# Patient Record
Sex: Male | Born: 1945 | Race: White | Hispanic: No | Marital: Married | Smoking: Never smoker
Health system: Southern US, Community
[De-identification: ages and names within clinical notes are randomized; demographics above are authoritative.]

## PROBLEM LIST (undated history)

## (undated) DIAGNOSIS — M1990 Unspecified osteoarthritis, unspecified site: Secondary | ICD-10-CM

## (undated) DIAGNOSIS — C61 Malignant neoplasm of prostate: Secondary | ICD-10-CM

## (undated) DIAGNOSIS — Z5189 Encounter for other specified aftercare: Secondary | ICD-10-CM

## (undated) DIAGNOSIS — D649 Anemia, unspecified: Secondary | ICD-10-CM

## (undated) DIAGNOSIS — R972 Elevated prostate specific antigen [PSA]: Secondary | ICD-10-CM

## (undated) DIAGNOSIS — E7800 Pure hypercholesterolemia, unspecified: Secondary | ICD-10-CM

## (undated) DIAGNOSIS — N400 Enlarged prostate without lower urinary tract symptoms: Secondary | ICD-10-CM

## (undated) DIAGNOSIS — N419 Inflammatory disease of prostate, unspecified: Secondary | ICD-10-CM

## (undated) DIAGNOSIS — Q619 Cystic kidney disease, unspecified: Secondary | ICD-10-CM

## (undated) HISTORY — DX: Inflammatory disease of prostate, unspecified: N41.9

## (undated) HISTORY — DX: Benign prostatic hyperplasia without lower urinary tract symptoms: N40.0

## (undated) HISTORY — PX: COLONOSCOPY: SHX174

## (undated) HISTORY — DX: Unspecified osteoarthritis, unspecified site: M19.90

## (undated) HISTORY — DX: Malignant neoplasm of prostate: C61

## (undated) HISTORY — PX: JOINT REPLACEMENT: SHX530

## (undated) HISTORY — DX: Pure hypercholesterolemia, unspecified: E78.00

## (undated) HISTORY — DX: Elevated prostate specific antigen (PSA): R97.20

## (undated) HISTORY — DX: Encounter for other specified aftercare: Z51.89

## (undated) HISTORY — DX: Cystic kidney disease, unspecified: Q61.9

---

## 2007-07-26 DIAGNOSIS — D649 Anemia, unspecified: Secondary | ICD-10-CM

## 2007-07-26 HISTORY — DX: Anemia, unspecified: D64.9

## 2007-07-26 HISTORY — PX: TOTAL HIP ARTHROPLASTY: SHX124

## 2010-04-20 DIAGNOSIS — C61 Malignant neoplasm of prostate: Secondary | ICD-10-CM

## 2010-04-20 HISTORY — PX: PROSTATE BIOPSY: SHX241

## 2010-04-20 HISTORY — DX: Malignant neoplasm of prostate: C61

## 2010-09-23 HISTORY — PX: TOTAL HIP ARTHROPLASTY: SHX124

## 2011-09-12 ENCOUNTER — Encounter: Payer: Self-pay | Admitting: *Deleted

## 2011-09-12 DIAGNOSIS — E7800 Pure hypercholesterolemia, unspecified: Secondary | ICD-10-CM | POA: Insufficient documentation

## 2011-09-12 DIAGNOSIS — N419 Inflammatory disease of prostate, unspecified: Secondary | ICD-10-CM | POA: Insufficient documentation

## 2011-09-12 NOTE — Progress Notes (Signed)
Married, works full time, 3 children

## 2011-09-13 ENCOUNTER — Encounter: Payer: Self-pay | Admitting: Radiation Oncology

## 2011-09-13 ENCOUNTER — Ambulatory Visit
Admission: RE | Admit: 2011-09-13 | Discharge: 2011-09-13 | Disposition: A | Discharge status: Home / Self Care | Payer: BC Managed Care – PPO | Source: Ambulatory Visit | Attending: Radiation Oncology | Admitting: Radiation Oncology

## 2011-09-13 VITALS — BP 123/85 | HR 62 | Temp 97.5°F | Resp 18 | Ht 68.0 in | Wt 168.1 lb

## 2011-09-13 DIAGNOSIS — C61 Malignant neoplasm of prostate: Secondary | ICD-10-CM

## 2011-09-13 DIAGNOSIS — E7800 Pure hypercholesterolemia, unspecified: Secondary | ICD-10-CM | POA: Insufficient documentation

## 2011-09-13 DIAGNOSIS — Z79899 Other long term (current) drug therapy: Secondary | ICD-10-CM | POA: Insufficient documentation

## 2011-09-13 NOTE — Progress Notes (Signed)
Please see the Nurse Progress Note in the MD Initial Consult Encounter for this patient. 

## 2011-09-13 NOTE — Progress Notes (Signed)
Complete NUTRITION RISK SCREEN worksheet without concern submitted to Zenovia Jarred, RD. Also, complete PATIENT MEASURE OF DISTRESS WORKSHEET with a score of 3 submitted to social work.

## 2011-09-13 NOTE — Progress Notes (Signed)
66 year old male. Married with 3 children. Works full time.  Patient has been under active surveillance with Dr. Saddie Benders since 04/20/10 for prostate cancer. Rise in PSA sparked this consultation.  Prostate biopsy done 04/20/10 revealed  right mid, L lat apex prostatic adenocarcinoma gleason 6  L lat base prostatic adenocarcinoma gleason 6 prostatic intraepithelial neoplasia high grade  01/29/2011 PSA 3.4 05/25/2011 PSA 4.4 08/26/2011 PSA 5.2  MW:NUUVO = itching No indication of a pacemaker No hx of radiation therapy

## 2011-09-13 NOTE — Progress Notes (Signed)
[facility] Radiation Oncology NEW PATIENT EVALUATION  Name: [name] MRN: [id number]  Date: 09/13/2011  DOB: May 08, 1946  Status: outpatient   CC: Joseph Pen, MD, MD  Debroah Baller, MD    REFERRING PHYSICIAN: Debroah Baller, MD   DIAGNOSIS: Stage TI C. favorable risk adenocarcinoma prostate    HISTORY OF PRESENT ILLNESS:  [name] is a 66 y.o. male who is seen today for the courtesy of Dr. Saddie Benders for discussion of possible seed implantation in the management of his stage TI C. favorable risk adenocarcinoma prostate he presented with a PSA of 3.56 in the summer of 2011. He underwent ultrasound-guided biopsies on 04/21/2010 diagnosis of Gleason 6 (3+3) involving 2% and 8% biopsies from the right mid gland and left lateral apex. He also had Gleason 6 involving 6% of the biopsy from the left lateral base. His gland size was approximately 30 cc. He elected for close surveillance with PSA determinations every 3 months and a repeat biopsy in 18 months. His PSA in April 2012 was 3.4, rising to 4.4 in October of 2012, and most recently 5.2 on 08/26/2011. He is doing well from a GU and GI standpoint. His I PSS score is 2. He does have some degree of erectile dysfunction and has used Viagra in the past. He is not sexually active.   PREVIOUS RADIATION THERAPY: No   PAST MEDICAL HISTORY:  has a past medical history of BPH (benign prostatic hyperplasia); Hypercholesterolemia; Prostatitis; Prostate cancer (04/20/2010); Unspecified congenital cystic kidney disease; Elevated prostate specific antigen (PSA); and Pure hypercholesterolemia.     PAST SURGICAL HISTORY:  Past Surgical History  Procedure Date  . Total hip arthroplasty 2009    right hip resurfacing  . Prostate biopsy   . Total hip arthroplasty 09/2010    left hip resur[facility] at [facility]     FAMILY HISTORY: family history includes Cancer in his mother and sister; Diabetes in his mother; Heart disease in his brother  and paternal grandfather; Hypertension in his father; and Stroke in his maternal grandmother. His father died at age 17 from a heart attack, and his mother died from complications of diabetes mellitus at 75. His mother was diagnosed with early stage colon cancer in her mid 31s.   SOCIAL HISTORY:  reports that he has never smoked. He has never used smokeless tobacco. He reports that he drinks alcohol. He reports that he does not use illicit drugs. Married with 3 children. 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   ALLERGIES: Ciprofloxacin   MEDICATIONS:  Current Outpatient Prescriptions  Medication Sig Dispense Refill  . Ascorbic Acid (VITAMIN C) 1000 MG tablet Take 1,000 mg by mouth daily.      . fish oil-omega-3 fatty acids 1000 MG capsule Take 2 g by mouth daily.      . Lopinavir-Ritonavir (KALETRA PO) Take by mouth.      . Multiple Vitamin (MULTIVITAMIN) tablet Take 1 tablet by mouth daily.      Marland Kitchen aspirin 81 MG tablet Take 81 mg by mouth daily.          REVIEW OF SYSTEMS:  Pertinent items are noted in HPI.    PHYSICAL EXAM:  height is 5\' 8"  (1.727 m) and weight is 168 lb 1.6 oz (76.25 kg). His oral temperature is 97.5 F (36.4 C). His blood pressure is 123/85 and his pulse is 62. His respiration is 18 and oxygen saturation is 100%.   Head  and neck examination: Grossly unremarkable. Nodes: Without palpable cervical, supraclavicular lymphadenopathy. Chest: Lungs clear. Heart: Regular in rhythm. Back: Without spinal or CVA tenderness. Heart: Regular rate and rhythm. Abdomen: Without masses or organomegaly. Genitalia: Grossly unremarkable. Rectal: The prostate is normal in size and is without focal induration or nodularity. Extremities: Without edema. Neurologic examination grossly nonfocal.   LABORATORY DATA:  No results found for this basename: WBC, HGB, HCT, MCV, PLT   No results found for this basename: NA, K, CL, CO2   No results found  for this basename: ALT, AST, GGT, ALKPHOS, BILITOT   PSA 5.2 from 08/26/2011   IMPRESSION: Stage TI C. favorable risk adenocarcinoma prostate. I explained to the patient that his prognosis is related to his stage, PSA level, and Gleason score. All are favorable. We discussed surgery versus continued close surveillance versus radiation therapy. Radiation therapy options include 8 weeks of external beam/IMRT or seed implantation as monotherapy. Considering his bilateral hip surgery he would probably have the artifact which would affect image guidance for external beam/IMRT. We discussed seed implantation in great detail. We discussed the potential acute and late toxicities including both bladder and rectal toxicity. I feel that this would be a good option for him. He'll now visit Dr[facility]Hemel at [facility] to hear about robotic surgery. The patient will  contact me if he would like to proceed with seed implantation. I would still need to do a CT arch study prior to ordering his seeds. Because of scatter artifact, his post implant dosimetry would be done on the Tomotherapy unit for planning with megavoltage rather than kilovoltage x-rays because of the relative absence of scatter artifact with megavoltage x-rays.   PLAN: As discussed above.   I spent 60 minutes minutes face to face with the patient and more than 50% of that time was spent in counseling and/or coordination of care.

## 2011-09-13 NOTE — Progress Notes (Signed)
Patient presented to the clinic today for new consult with Dr. Dayton Scrape. Patient is unaccompanied. Patient is alert and oriented to person, place, and time. No distress noted. Steady gait noted. Pleasant affect noted. Patient denies pain at this time. Patient reports running 3-4 times per week. Patient denies burning upon urination or hematuria. Patient reports that he may get up once per night to void. Patient brought disc with recent "CAT scan without and with dye from [facility] for Dr. Dayton Scrape to review. Patient has no other complaints. Patient reports that he is hear today to discuss radiation therapy with Dr. Dayton Scrape so that he can decide if he want to have robotic surgery or radiation. Reported all findings to Dr. Dayton Scrape

## 2011-09-13 NOTE — Progress Notes (Signed)
Per Stanton Kidney at Dr. Francis Dowse' office reports this patient's prostate volume was 27.4 on the  04/20/2010 ultrasound.

## 2011-09-14 NOTE — Progress Notes (Signed)
Encounter addended by: Ardell Isaacs, RN on: 09/14/2011 12:30 PM<BR>     Documentation filed: Charges VN, Inpatient Patient Education

## 2011-11-29 ENCOUNTER — Encounter: Payer: Self-pay | Admitting: Radiation Oncology

## 2011-11-30 ENCOUNTER — Ambulatory Visit
Admission: RE | Admit: 2011-11-30 | Discharge: 2011-11-30 | Disposition: A | Discharge status: Home / Self Care | Payer: BC Managed Care – PPO | Source: Ambulatory Visit | Attending: Radiation Oncology | Admitting: Radiation Oncology

## 2011-11-30 ENCOUNTER — Encounter: Payer: Self-pay | Admitting: Radiation Oncology

## 2011-11-30 VITALS — BP 119/74 | HR 74 | Temp 97.6°F | Resp 20 | Ht 68.0 in | Wt 169.3 lb

## 2011-11-30 DIAGNOSIS — C61 Malignant neoplasm of prostate: Secondary | ICD-10-CM

## 2011-11-30 NOTE — Progress Notes (Signed)
Simulation/treatment planning note:  The patient was taken to the CT simulator. His pelvis was scanned. I contoured the prostate and projected the prostate along the pubic arch. The bony arch was open. The prostate volume is 33.9 cc and a prosthetic length is 3.6 cm. I prescribing 14,500 cGy to the prostate utilizing I-125. He'll be implanted with Nucletron system.

## 2011-11-30 NOTE — Progress Notes (Signed)
Please see the Nurse Progress Note in the MD Initial Consult Encounter for this patient. 

## 2011-11-30 NOTE — Progress Notes (Signed)
Encounter addended by: Maryln Gottron, MD on: 11/30/2011  5:00 PM<BR>     Documentation filed: Flowsheet VN

## 2011-11-30 NOTE — Progress Notes (Signed)
Followup note:  Diagnosis: Stage TI C. favorable risk adenocarcinoma prostate  The patient returns today for review and scheduling of prostate seed implantation in the management of his stage TI C. favorable risk adenocarcinoma prostate. I saw the patient in consultation on 09/13/2011 at which time he presented with a PSA of 3.56 in the summer of 2011 with ultrasound guided biopsies on 04/21/2010 diagnostic for Gleason 6 (3+3) involving 2% and 8% of biopsies from the right mid gland and left lateral apex, respectively. He also had Gleason 6 involving 6% of one biopsy from the left lateral base. His gland volume was 30 cc. He elected for close surveillance with a rise in his PSA to 3.4 by April 2012, 4.4 in October 2012 and most recently 5.2 on 08/26/2011. His PSA was 4.7 on 11/23/2011. He was going to see Dr. Creed Copper At [facility] to investigate robotic surgery, but he decide on seed implantation and canceled his appointment with Dr. Creed Copper. Again, he is doing well from a GU and GI standpoint.  Physical examination: Not performed.  Impression: Stage TI C. favorable risk adenocarcinoma prostate. I again discussed the potential acute and late toxicities of radiation therapy and he wishes to proceed as outlined. We will proceed with a CT arch study today and they get him scheduled for seed implantation with Dr. Albin Felling in approximately 4-6 weeks. Consent is signed today.  30 minutes was spent face-to-face with the patient, primarily counseling the patient and coordinating his care.

## 2011-11-30 NOTE — Progress Notes (Addendum)
Married, 3 children  11/23/11 PSA 4.7

## 2011-12-01 NOTE — Progress Notes (Signed)
Encounter addended by: Glennie Hawk, RN on: 12/01/2011  1:30 PM<BR>     Documentation filed: Charges VN

## 2011-12-02 ENCOUNTER — Encounter: Payer: Self-pay | Admitting: Radiation Oncology

## 2011-12-02 NOTE — Progress Notes (Signed)
CT arch study.    

## 2011-12-06 ENCOUNTER — Telehealth: Payer: Self-pay | Admitting: *Deleted

## 2011-12-06 NOTE — Telephone Encounter (Signed)
CALLED PATIENT TO INFORM OF IMPLANT DATE, LVM FOR A RETURN CALL 

## 2011-12-07 ENCOUNTER — Telehealth: Payer: Self-pay | Admitting: *Deleted

## 2011-12-07 NOTE — Telephone Encounter (Signed)
CALLED PATIENT TO INFORM OF IMPLANT DATE , LEFT MESSAGE ON ANSWERING MACHINE FOR A RETURN CALL

## 2011-12-13 ENCOUNTER — Ambulatory Visit (HOSPITAL_COMMUNITY)
Admission: RE | Admit: 2011-12-13 | Discharge: 2011-12-13 | Disposition: A | Discharge status: Home / Self Care | Payer: BC Managed Care – PPO | Source: Ambulatory Visit | Attending: Urology | Admitting: Urology

## 2011-12-13 ENCOUNTER — Ambulatory Visit (HOSPITAL_BASED_OUTPATIENT_CLINIC_OR_DEPARTMENT_OTHER)
Admission: RE | Admit: 2011-12-13 | Discharge: 2011-12-13 | Disposition: A | Discharge status: Home / Self Care | Payer: BC Managed Care – PPO | Source: Ambulatory Visit | Attending: Urology | Admitting: Urology

## 2011-12-13 DIAGNOSIS — C61 Malignant neoplasm of prostate: Secondary | ICD-10-CM | POA: Insufficient documentation

## 2011-12-13 DIAGNOSIS — Z01818 Encounter for other preprocedural examination: Secondary | ICD-10-CM | POA: Insufficient documentation

## 2012-01-03 ENCOUNTER — Telehealth: Payer: Self-pay | Admitting: *Deleted

## 2012-01-03 NOTE — Telephone Encounter (Signed)
CALLED PATIENT TO REMIND OF APPT., LVM FOR A RETURN CALL 

## 2012-01-04 ENCOUNTER — Encounter (HOSPITAL_BASED_OUTPATIENT_CLINIC_OR_DEPARTMENT_OTHER): Payer: Self-pay | Admitting: *Deleted

## 2012-01-04 LAB — COMPREHENSIVE METABOLIC PANEL
ALT: 17 U/L (ref 0–53)
AST: 23 U/L (ref 0–37)
Albumin: 4.4 g/dL (ref 3.5–5.2)
Alkaline Phosphatase: 62 U/L (ref 39–117)
CO2: 28 mEq/L (ref 19–32)
Chloride: 102 mEq/L (ref 96–112)
GFR calc non Af Amer: 51 mL/min — ABNORMAL LOW (ref >90)
Potassium: 4.1 mEq/L (ref 3.5–5.1)
Total Bilirubin: 0.4 mg/dL (ref 0.3–1.2)

## 2012-01-04 LAB — PROTIME-INR
INR: 0.89 (ref 0.00–1.49)
Prothrombin Time: 12.2 seconds (ref 11.6–15.2)

## 2012-01-04 LAB — CBC
MCH: 29.8 pg (ref 26.0–34.0)
MCHC: 33.5 g/dL (ref 30.0–36.0)
MCV: 89.1 fL (ref 78.0–100.0)
Platelets: 239 10*3/uL (ref 150–400)
RDW: 12.7 % (ref 11.5–15.5)

## 2012-01-04 NOTE — Progress Notes (Signed)
Pt instructed NPO p MN 6/18 x clear liquids til 0800.  Then absolutely nothing by mouth.  To [facility] 6/19 @ 1245.  Pt reminded to do fleets enema am of surgery.

## 2012-01-10 ENCOUNTER — Telehealth: Payer: Self-pay | Admitting: *Deleted

## 2012-01-10 NOTE — Telephone Encounter (Signed)
CALLED PATIENT TO REMIND OF PROCEDURE FOR 01-11-12, LVM FOR A RETURN CALL 

## 2012-01-10 NOTE — H&P (Signed)
NAMEITAI, BARBIAN NO.:  [id number]  MEDICAL RECORD NO.:  [id number]  LOCATION:                               FACILITY:  [facility]  PHYSICIAN:  Debroah Baller, M.D.     DATE OF BIRTH:  1946-07-01  DATE OF ADMISSION: DATE OF DISCHARGE:                             HISTORY & PHYSICAL   HISTORY OF PRESENT ILLNESS:  This is a 66 year old man with a diagnosis of prostate cancer and has been under active surveillance.  His PSA has increased to 4.7 and he has reviewed his options and has opted for a therapeutic intervention.  Previous prostate biopsy had shown Gleason 6 (3+3) involving 2% and 8% of biopsy cores on the right and one on the left involving 6% of the core.  He at this time has opted for radioactive seed implantation.  PAST MEDICAL HISTORY:  Shows he has a history of elevated cholesterol and history of BPH.  PAST SURGICAL HISTORY:  Include right total hip replacement in 2009.  He does not smoke.  ALLERGIES:  HE HAS AN ALLERGY TO CIPRO, WHICH CAUSES SOME ITCHING.  MEDICATIONS:  His only medication had been low-dose aspirin.  He also takes fish oil, omega-3.  FAMILY HISTORY:  Includes coronary artery disease and his mother was diagnosed with early stage colon cancer in her 60s.  REVIEW OF SYSTEMS:  Show no history of diabetes, chest pain, shortness of breath.  Bowel movements are regular.  Otherwise unremarkable.  PHYSICAL EXAMINATION:  GENERAL:  A well-developed, well-nourished man, in no acute distress. BACK:  Without CVA tenderness. HEAD:  Normocephalic and atraumatic. NECK:  Without masses. LUNGS:  Clear to auscultation. HEART:  Sounds are regular rate and rhythm.  No murmurs. ABDOMEN:  Soft, nontender, scaphoid. GU:  Shows the penis is without lesions.  Both testes are descended without masses.  No external scrotal lesions. RECTAL:  Reveals an approximately 30 g prostate, smooth and nontender. External sphincter normal. EXTREMITIES:  Lower  extremity show no clubbing nor edema nor calf tenderness. NEUROLOGIC:  He is nonfocal.  Pertinent laboratories are pending at this time.  IMPRESSION:  Stage T1-T2 prostate cancer, N0 M0.  PLAN:  The patient has been discussed his options, including surgery and radiation.  He has opted for definitive therapeutic intervention with radioactive seed implantation.  We will go ahead and proceed with this at this time.          ______________________________ Debroah Baller, M.D.     RC/MEDQ  D:  01/10/2012  T:  01/10/2012  Job:  [id number]

## 2012-01-11 ENCOUNTER — Ambulatory Visit (HOSPITAL_BASED_OUTPATIENT_CLINIC_OR_DEPARTMENT_OTHER)
Admission: RE | Admit: 2012-01-11 | Discharge: 2012-01-11 | Disposition: A | Discharge status: Home / Self Care | Payer: BC Managed Care – PPO | Source: Ambulatory Visit | Attending: Urology | Admitting: Urology

## 2012-01-11 ENCOUNTER — Ambulatory Visit (HOSPITAL_COMMUNITY): Payer: BC Managed Care – PPO

## 2012-01-11 ENCOUNTER — Encounter: Payer: Self-pay | Admitting: Radiation Oncology

## 2012-01-11 ENCOUNTER — Ambulatory Visit (HOSPITAL_BASED_OUTPATIENT_CLINIC_OR_DEPARTMENT_OTHER): Payer: BC Managed Care – PPO | Admitting: Anesthesiology

## 2012-01-11 ENCOUNTER — Encounter (HOSPITAL_BASED_OUTPATIENT_CLINIC_OR_DEPARTMENT_OTHER): Payer: Self-pay | Admitting: Anesthesiology

## 2012-01-11 ENCOUNTER — Encounter (HOSPITAL_BASED_OUTPATIENT_CLINIC_OR_DEPARTMENT_OTHER)
Admission: RE | Disposition: A | Discharge status: Home / Self Care | Payer: Self-pay | Source: Ambulatory Visit | Attending: Urology

## 2012-01-11 ENCOUNTER — Encounter (HOSPITAL_BASED_OUTPATIENT_CLINIC_OR_DEPARTMENT_OTHER): Payer: Self-pay | Admitting: *Deleted

## 2012-01-11 ENCOUNTER — Encounter (HOSPITAL_BASED_OUTPATIENT_CLINIC_OR_DEPARTMENT_OTHER): Payer: Self-pay | Admitting: Urology

## 2012-01-11 DIAGNOSIS — M479 Spondylosis, unspecified: Secondary | ICD-10-CM | POA: Insufficient documentation

## 2012-01-11 DIAGNOSIS — C61 Malignant neoplasm of prostate: Secondary | ICD-10-CM

## 2012-01-11 DIAGNOSIS — Z79899 Other long term (current) drug therapy: Secondary | ICD-10-CM | POA: Insufficient documentation

## 2012-01-11 DIAGNOSIS — I77819 Aortic ectasia, unspecified site: Secondary | ICD-10-CM | POA: Insufficient documentation

## 2012-01-11 DIAGNOSIS — Z01812 Encounter for preprocedural laboratory examination: Secondary | ICD-10-CM | POA: Insufficient documentation

## 2012-01-11 HISTORY — PX: RADIOACTIVE SEED IMPLANT: SHX5150

## 2012-01-11 HISTORY — DX: Anemia, unspecified: D64.9

## 2012-01-11 SURGERY — INSERTION, RADIATION SOURCE, PROSTATE
Anesthesia: General

## 2012-01-11 MED ORDER — BELLADONNA ALKALOIDS-OPIUM 16.2-60 MG RE SUPP
RECTAL | Status: DC | PRN
  Administered 2012-01-11: 1 via RECTAL

## 2012-01-11 MED ORDER — LACTATED RINGERS IV SOLN
INTRAVENOUS | Status: DC
  Administered 2012-01-11 (×2): via INTRAVENOUS

## 2012-01-11 MED ORDER — STERILE WATER FOR IRRIGATION IR SOLN
Status: DC | PRN
  Administered 2012-01-11: 1

## 2012-01-11 MED ORDER — GLYCOPYRROLATE 0.2 MG/ML IJ SOLN
INTRAMUSCULAR | Status: DC | PRN
  Administered 2012-01-11: 0.2 mg via INTRAVENOUS

## 2012-01-11 MED ORDER — EPHEDRINE SULFATE 50 MG/ML IJ SOLN
INTRAMUSCULAR | Status: DC | PRN
  Administered 2012-01-11 (×2): 10 mg via INTRAVENOUS

## 2012-01-11 MED ORDER — MIDAZOLAM HCL 5 MG/5ML IJ SOLN
INTRAMUSCULAR | Status: DC | PRN
  Administered 2012-01-11: 2 mg via INTRAVENOUS

## 2012-01-11 MED ORDER — SULFAMETHOXAZOLE-TRIMETHOPRIM 800-160 MG PO TABS: 1.0000 | ORAL_TABLET | Freq: Two times a day (BID) | ORAL | Status: AC

## 2012-01-11 MED ORDER — DEXTROSE 5 % IV SOLN: 1.0000 g | INTRAVENOUS | Status: DC

## 2012-01-11 MED ORDER — FLEET ENEMA 7-19 GM/118ML RE ENEM: 1.0000 | ENEMA | Freq: Once | RECTAL | Status: DC

## 2012-01-11 MED ORDER — CIPROFLOXACIN IN D5W 400 MG/200ML IV SOLN
400.0000 mg | INTRAVENOUS | Status: DC
  Administered 2012-01-11: 400 mg via INTRAVENOUS

## 2012-01-11 MED ORDER — OXYCODONE HCL 5 MG PO TABS: 5.0000 mg | ORAL_TABLET | Freq: Four times a day (QID) | ORAL | Status: AC | PRN

## 2012-01-11 MED ORDER — OXYCODONE HCL 5 MG PO TABS
5.0000 mg | ORAL_TABLET | ORAL | Status: DC | PRN
  Administered 2012-01-11: 5 mg via ORAL

## 2012-01-11 MED ORDER — PROPOFOL 10 MG/ML IV EMUL
INTRAVENOUS | Status: DC | PRN
  Administered 2012-01-11: 180 mg via INTRAVENOUS

## 2012-01-11 MED ORDER — FENTANYL CITRATE 0.05 MG/ML IJ SOLN: 25.0000 ug | INTRAMUSCULAR | Status: DC | PRN

## 2012-01-11 MED ORDER — DEXTROSE IN LACTATED RINGERS 5 % IV SOLN: 100.0000 mL/h | INTRAVENOUS | Status: DC

## 2012-01-11 MED ORDER — ONDANSETRON HCL 4 MG/2ML IJ SOLN
INTRAMUSCULAR | Status: DC | PRN
  Administered 2012-01-11: 4 mg via INTRAVENOUS

## 2012-01-11 MED ORDER — LIDOCAINE HCL (CARDIAC) 20 MG/ML IV SOLN
INTRAVENOUS | Status: DC | PRN
  Administered 2012-01-11: 60 mg via INTRAVENOUS

## 2012-01-11 MED ORDER — FENTANYL CITRATE 0.05 MG/ML IJ SOLN
INTRAMUSCULAR | Status: DC | PRN
  Administered 2012-01-11: 50 ug via INTRAVENOUS
  Administered 2012-01-11 (×6): 25 ug via INTRAVENOUS

## 2012-01-11 MED ORDER — IOHEXOL 350 MG/ML SOLN
INTRAVENOUS | Status: DC | PRN
  Administered 2012-01-11: 3 mL

## 2012-01-11 MED ORDER — DEXAMETHASONE SODIUM PHOSPHATE 4 MG/ML IJ SOLN
INTRAMUSCULAR | Status: DC | PRN
  Administered 2012-01-11: 8 mg via INTRAVENOUS

## 2012-01-11 SURGICAL SUPPLY — 25 items
BLADE SURG ROTATE 9660 (MISCELLANEOUS) ×2 IMPLANT
CATH FOLEY 2WAY SLVR  5CC 16FR (CATHETERS) ×2
CATH FOLEY 2WAY SLVR 5CC 16FR (CATHETERS) ×2 IMPLANT
CATH ROBINSON RED A/P 20FR (CATHETERS) ×2 IMPLANT
CLOTH BEACON ORANGE TIMEOUT ST (SAFETY) ×2 IMPLANT
COVER MAYO STAND STRL (DRAPES) ×2 IMPLANT
COVER TABLE BACK 60X90 (DRAPES) ×2 IMPLANT
DRSG TEGADERM 4X4.75 (GAUZE/BANDAGES/DRESSINGS) ×4 IMPLANT
DRSG TEGADERM 8X12 (GAUZE/BANDAGES/DRESSINGS) ×2 IMPLANT
GLOVE BIO SURGEON STRL SZ7.5 (GLOVE) ×8 IMPLANT
GLOVE BIOGEL M 7.0 STRL (GLOVE) ×2 IMPLANT
GLOVE BIOGEL PI IND STRL 6.5 (GLOVE) ×1 IMPLANT
GLOVE BIOGEL PI INDICATOR 6.5 (GLOVE) ×1
GLOVE ECLIPSE 8.0 STRL XLNG CF (GLOVE) IMPLANT
GLOVE SURG SIGNA 7.5 PF LTX (GLOVE) ×4 IMPLANT
GOWN PREVENTION PLUS LG XLONG (DISPOSABLE) ×2 IMPLANT
GOWN STRL REIN XL XLG (GOWN DISPOSABLE) ×2 IMPLANT
HOLDER FOLEY CATH W/STRAP (MISCELLANEOUS) ×2 IMPLANT
PACK CYSTOSCOPY (CUSTOM PROCEDURE TRAY) ×2 IMPLANT
PAD PREP 24X48 CUFFED NSTRL (MISCELLANEOUS) ×2 IMPLANT
SPONGE GAUZE 4X4 12PLY (GAUZE/BANDAGES/DRESSINGS) ×2 IMPLANT
SYRINGE 10CC LL (SYRINGE) ×2 IMPLANT
UNDERPAD 30X30 INCONTINENT (UNDERPADS AND DIAPERS) ×4 IMPLANT
WATER STERILE IRR 500ML POUR (IV SOLUTION) ×2 IMPLANT
prostate seeds (Urological Implant) ×156 IMPLANT

## 2012-01-11 NOTE — Anesthesia Preprocedure Evaluation (Signed)
Anesthesia Evaluation  Patient identified by MRN, date of birth, ID band Patient awake    Reviewed: Allergy & Precautions, H&P , NPO status , Patient's Chart, lab work & pertinent test results, reviewed documented beta blocker date and time   Airway Mallampati: II TM Distance: >3 FB Neck ROM: Full    Dental  (+) Teeth Intact and Dental Advisory Given   Pulmonary neg pulmonary ROS,  breath sounds clear to auscultation        Cardiovascular Rhythm:Regular Rate:Normal  Denies cardiac symptoms   Neuro/Psych negative neurological ROS  negative psych ROS   GI/Hepatic negative GI ROS, Neg liver ROS,   Endo/Other  negative endocrine ROS  Renal/GU Cr 1.4   Hx prostate cancer    Musculoskeletal negative musculoskeletal ROS (+)   Abdominal   Peds negative pediatric ROS (+)  Hematology negative hematology ROS (+)   Anesthesia Other Findings   Reproductive/Obstetrics negative OB ROS                           Anesthesia Physical Anesthesia Plan  ASA: II  Anesthesia Plan: General   Post-op Pain Management:    Induction: Intravenous  Airway Management Planned: LMA  Additional Equipment:   Intra-op Plan:   Post-operative Plan: Extubation in OR  Informed Consent: I have reviewed the patients History and Physical, chart, labs and discussed the procedure including the risks, benefits and alternatives for the proposed anesthesia with the patient or authorized representative who has indicated his/her understanding and acceptance.   Dental advisory given  Plan Discussed with: CRNA and Surgeon  Anesthesia Plan Comments:         Anesthesia Quick Evaluation

## 2012-01-11 NOTE — H&P (Signed)
H & P was reviewed and there have been no changesCecile Sheerer MD

## 2012-01-11 NOTE — Anesthesia Procedure Notes (Signed)
Procedure Name: LMA Insertion Date/Time: 01/11/2012 2:15 PM Performed by: Renella Cunas D Pre-anesthesia Checklist: Patient identified, Emergency Drugs available, Suction available and Patient being monitored Patient Re-evaluated:Patient Re-evaluated prior to inductionOxygen Delivery Method: Circle System Utilized Preoxygenation: Pre-oxygenation with 100% oxygen Intubation Type: IV induction Ventilation: Mask ventilation without difficulty LMA: LMA inserted LMA Size: 4.0 Number of attempts: 1 Airway Equipment and Method: bite block Placement Confirmation: positive ETCO2 Tube secured with: Tape Dental Injury: Teeth and Oropharynx as per pre-operative assessment

## 2012-01-11 NOTE — Progress Notes (Signed)
Patient had alittle itching previously with cipro. Drs. Dayton Scrape and Dr. Saddie Benders informed patient received cipro with no reaction no itching swelling or breathing difficulties.  It was given at a very slow rate.

## 2012-01-11 NOTE — Progress Notes (Addendum)
[facility] Radiation Oncology Brachytherapy Operative Procedure Note  Name: [name] MRN: [id number]  Date:   12/06/2011           DOB: August 13, 1945  Status:outpatient    WU:[id number],NWGNFA A, MD  Dr. Debroah Baller DIAGNOSIS: A 66 year old gentlemen with stage T1 C. adenocarcinoma of the prostate with a Gleason of 6 and a PSA of 4.7.  PROCEDURE: Insertion of radioactive I-125 seeds into the prostate gland.  RADIATION DOSE: 14,500 cGy, definitive therapy  TECHNIQUE: Lamar Meter was brought to the operating room with Dr. Saddie Benders. He was placed in the dorsolithotomy position. He was catheterized and a rectal tube was inserted. The perineum was shaved, prepped and draped. The ultrasound probe was then introduced into the rectum to see the prostate gland.  TREATMENT DEVICE: A needle grid was attached to the ultrasound probe stand and anchor needles were placed.  COMPLEX ISODOSE CALCULATION: The prostate was imaged in 3D using a sagittal sweep of the prostate probe. These images were transferred to the planning computer. There, the prostate, urethra and rectum were defined on each axial reconstructed image. Then, the software created an optimized plan and a few seed positions were adjusted. Then the accepted plan was uploaded to the seed Selectron afterloading unit.  SPECIAL TREATMENT PROCEDURE/SUPERVISION AND HANDLING: The Nucletron FIRST system was used to place the needles under sagittal guidance. A total of 28 needles were used to deposit 78 seeds in the prostate gland. The individual seed activity was 0.431 mCi for a total implant activity of 33.6 mCi.  COMPLEX SIMULATION: At the end of the procedure, an anterior radiograph of the pelvis was obtained to document seed positioning and count. Cystoscopy was performed to check the urethra and bladder.  MICRODOSIMETRY: At the end of the procedure, the patient was emitting 0.7 mrem/hr at 1 meter. Accordingly, he was considered safe for  hospital discharge.  PLAN: The patient will return to the radiation oncology clinic for post implant CT dosimetry in three weeks.

## 2012-01-11 NOTE — Transfer of Care (Signed)
Immediate Anesthesia Transfer of Care Note  Patient: [name]  Procedure(s) Performed: Procedure(s) (LRB): RADIOACTIVE SEED IMPLANT (N/A)  Patient Location: PACU  Anesthesia Type: General  Level of Consciousness: awake, oriented, sedated and patient cooperative  Airway & Oxygen Therapy: Patient Spontanous Breathing and Patient connected to face mask oxygen  Post-op Assessment: Report given to PACU RN and Post -op Vital signs reviewed and stable  Post vital signs: Reviewed and stable  Complications: No apparent anesthesia complications

## 2012-01-11 NOTE — Op Note (Signed)
Op note dictated- Job #: [id number]

## 2012-01-11 NOTE — Progress Notes (Signed)
End of treatment summary:  Diagnosis: Stage TI C. favorable risk adenocarcinoma prostate  Requesting physician: Dr. Debroah Baller  Intent: Curative  Implant date: 01/11/2012  Site/dose: Prostate 14,500 cGy  Isotope: I-125 utilizing 78 seeds and 28 active needles with an individual seed activity 0.431 mCi per seed for a total implant activity of 33.6 mCi.  Narrative: The patient tolerated implant well and appears to have had a successful Nucletron seed Selectron implant.  Plan: Followup visit with Dr. Saddie Benders tomorrow morning, and a followup visit with me in approximately 3 weeks. At that time we will obtain a CT scan for his post implant dosimetry to assess the quality of his implant.

## 2012-01-11 NOTE — Discharge Instructions (Addendum)
  Post Anesthesia Home Care Instructions  Activity: Get plenty of rest for the remainder of the day. A responsible adult should stay with you for 24 hours following the procedure.  For the next 24 hours, DO NOT: -Drive a car -Advertising copywriter -Drink alcoholic beverages -Take any medication unless instructed by your physician -Make any legal decisions or sign important papers.  Meals: Start with liquid foods such as gelatin or soup. Progress to regular foods as tolerated. Avoid greasy, spicy, heavy foods. If nausea and/or vomiting occur, drink only clear liquids until the nausea and/or vomiting subsides. Call your physician if vomiting continues.  Special Instructions/Symptoms: Your throat may feel dry or sore from the anesthesia or the breathing tube placed in your throat during surgery. If this causes discomfort, gargle with warm salt water. The discomfort s hould disappear within 24 hours.    Radioactive Seed Implant Home Care Instructions   Activity:    Rest for the remainder of the day.  Do not drive or            operate equipment today.  You may resume normal     activities in a few days as instructed by your      physician, without risk of harmful radiation       exposure to those around you, provided you follow     the time and distance precautions on the Radiation     Oncology Instruction Sheet.   Meals: Drink plenty of lipuids and eat light foods, such as     gelatin or soup this evening .  You may return to normal meal    plan tomorrow.  Return To Work: You may return to work as instructed by Designer, multimedia.  Special Instruction: Remove foley catheter ***.  To remove foley, cut      short end of  "Y" on foley catheter.  Drain water.  After the    water has drained, pull catheter from bladder.   If any seeds are    found, use tweezers to pick up seeds and place in a glass    container of any kind and bring to your physician's office.  Call your physician if any of these  symptoms occur:   Persistent or heavy bleeding  Urine stream diminishes or stops completely after catheter is removed  Fever equal to or greater than 101 degrees F  Cloudy urine with a strong foul odor  Severe pain  You may feel some burning pain and/or hesitancy when you urinate after the catheter is removed.  These symptoms may increase over the next few weeks, but should diminish within forur to six weeks.  Applying moist heat to the lower abdomen or a hot tub bath may help relieve the pain.  If the discomfort becomes severe, please call your physician for additional medications.  Follow-up (Date of Return Visit to Physician): ***  Patient:_______________________________   @date @  Nurse:________________________________ @date @

## 2012-01-12 ENCOUNTER — Encounter (HOSPITAL_BASED_OUTPATIENT_CLINIC_OR_DEPARTMENT_OTHER): Payer: Self-pay | Admitting: Urology

## 2012-01-12 NOTE — Cardiovascular Report (Signed)
[name], [name] NO.:  [id number]  MEDICAL RECORD NO.:  [id number]  LOCATION:                               FACILITY:  [facility]  PHYSICIAN:  Debroah Baller, M.D.     DATE OF BIRTH:  April 15, 1946  DATE OF PROCEDURE:  01/11/2012 DATE OF DISCHARGE:                           CARDIAC CATHETERIZATION   HISTORY OF PRESENT ILLNESS:  This is a 66 year old man with a diagnosis of prostate cancer, and a rising PSA.  He was presented his options and has opted for a therapeutic intervention with a radioactive seed implants.  He is here for this today.  PROCEDURE:  Transrectal ultrasound, radioactive seed implantation, and cystoscopy.  SURGEON OF RECORD: 1. Debroah Baller, M.D. 2. Maryln Gottron, M.D.  ANESTHESIA:  General.  PROCEDURE IN DETAIL:  The patient was brought into the operating room, placed on the table in the supine position.  After adequate general anesthesia was achieved, he was carefully placed in exaggerated lithotomy with the Yellofin stirrups.  The perineum was then shaved, prepped, and draped.  A Foley catheter was inserted and the scrotum and its contents were elevated out of the perineal view and __________ lower abdomen using a clear OpSite.  A red rubber catheter was placed into the rectum to expel any excess air.  The transrectal ultrasound probe was then placed.  The prostate was scanned from apex to seminal vesicles, and a good imaging was obtained.  Anchor needles were placed 1 in each lobe.  The drapes were then placed over the patient.  The prostate was then scanned by the Nucletron program from apex to seminal vesicle.  The images were scanned into the program, and then the computer calculations were made for seed placements.  The individual border of the prostate was demarcated by examining each image between Dr. Dayton Scrape and myself to outline the prostatic borders, urethra, and rectum.  Once the computer program had determined the seed  placement, the individual images were then also reviewed in conjunction with the physicist to assure adequate delivery of radioactivity to the whole prostate.  We finalized the plan, and then seed implantation was begun.  Base needles were placed in the appropriate plane, and then the seeds were placed from superior to inferior, encompassing the whole prostate and basal seminal vesicles. The Foley catheter with balloon with contrast helped [place] to visualize the bladder neck.  When we were done, we had a good distribution of seeds and the whole prostate was treated.  Pelvic x-ray was obtained assuring all this.  The Foley catheter was then removed and flexible cystoscopy was performed, and there was no noted seeds within the bladder or prostatic urethra.  The penis was prepped and a Foley was replaced. Rectal exam revealed no intrarectal foreign bodies and __B&O________ suppository was placed for local analgesia at this point, and the procedure was terminated.  The patient had tolerated all this well.  He was then awakened and extubated and sent to the recovery room in good condition.          ______________________________ Debroah Baller, M.D.     RC/MEDQ  D:  01/11/2012  T:  01/12/2012  Job:  [id number] 

## 2012-01-12 NOTE — Anesthesia Postprocedure Evaluation (Signed)
Anesthesia Post Note  Patient: [name]  Procedure(s) Performed: Procedure(s) (LRB): RADIOACTIVE SEED IMPLANT (N/A)  Anesthesia type: General  Patient location: PACU  Post pain: Pain level controlled  Post assessment: Post-op Vital signs reviewed  Last Vitals:  Filed Vitals:   01/11/12 1745  BP: 140/81  Pulse: 78  Temp: 36.1 C  Resp: 16    Post vital signs: Reviewed  Level of consciousness: sedated  Complications: No apparent anesthesia complications

## 2012-01-31 ENCOUNTER — Telehealth: Payer: Self-pay | Admitting: *Deleted

## 2012-01-31 ENCOUNTER — Encounter: Payer: Self-pay | Admitting: Radiation Oncology

## 2012-01-31 NOTE — Telephone Encounter (Signed)
CALLED PATIENT TO REMIND OF APPTS. FOR 02-01-12, LVM FOR A RETURN CALL

## 2012-02-01 ENCOUNTER — Encounter: Payer: Self-pay | Admitting: Radiation Oncology

## 2012-02-01 ENCOUNTER — Ambulatory Visit
Admit: 2012-02-01 | Discharge: 2012-02-01 | Disposition: A | Discharge status: Home / Self Care | Payer: BC Managed Care – PPO | Attending: Radiation Oncology | Admitting: Radiation Oncology

## 2012-02-01 ENCOUNTER — Ambulatory Visit
Admission: RE | Admit: 2012-02-01 | Discharge: 2012-02-01 | Disposition: A | Discharge status: Home / Self Care | Payer: BC Managed Care – PPO | Source: Ambulatory Visit | Attending: Radiation Oncology | Admitting: Radiation Oncology

## 2012-02-01 VITALS — BP 129/83 | HR 64 | Temp 97.8°F | Resp 20 | Wt 167.1 lb

## 2012-02-01 DIAGNOSIS — Z79899 Other long term (current) drug therapy: Secondary | ICD-10-CM | POA: Insufficient documentation

## 2012-02-01 DIAGNOSIS — E7800 Pure hypercholesterolemia, unspecified: Secondary | ICD-10-CM | POA: Insufficient documentation

## 2012-02-01 DIAGNOSIS — C61 Malignant neoplasm of prostate: Secondary | ICD-10-CM

## 2012-02-01 NOTE — Progress Notes (Addendum)
Followup note:  [name] returns today approximately 3 weeks following his seed implant with Dr. Saddie Benders in the management of his stage TI C. favorable risk adenocarcinoma prostate. Following his implant he had difficulty with urinary frequency, urgency and dysuria. His symptoms are improved. He is off Flomax. He is back running. His CT scan for his post  implant dosimetry shows what appears to be a good seed distribution.  Physical examination: Not performed today.  Impression: Satisfactory progress with residual radiation urethritis.  Plan: He is scheduled see Dr. Saddie Benders this Friday for a followup visit. I've not scheduled the patient for a formal followup visit and I ask that Dr. Saddie Benders keep me posted on his progress/PSA determinations.

## 2012-02-01 NOTE — Progress Notes (Signed)
Complex simulation note:  The patient was taken to the CT scanner. He was placed supine. His pelvis was scanned. Specialized software was employed to reduce the scatter artifact from his bilateral hip replacements. The CT data set was sent to the [facility] system for contouring of his prostate and rectum. He is now ready for 3-D simulation.

## 2012-02-01 NOTE — Progress Notes (Signed)
Pt reports improving of s/p prostate seed implant problems. Was taking Flomax but states he doesn't have any more. Continues to experience discomfort w/urination, freq which varies, nocturia x 1, pressure, urgency, slow stream. Denies issues w/bm's, fatigue, loss of appetite. Appt w/Dr Saddie Benders 02/03/12.

## 2012-02-11 ENCOUNTER — Encounter: Payer: Self-pay | Admitting: Radiation Oncology

## 2012-02-11 NOTE — Progress Notes (Signed)
Post implant CT dosimetry/3-D simulation note:  The patient underwent post-implant CT dosimetry/3-D simulation to assess the quality of his prostate seed implant. Dose volume histograms were obtained for the prostate and rectum. His intraoperative prostate volume by ultrasound was 42.4 cc and his prostate volume by CT, postoperatively, was 40.8cc, a good correlation. His prostate D 90 is 120.6% and V100 97.8%,  both excellent. 1.39 cc of rectum received the prescribed dose of 14,500 cGy. This was slightly higher than prescribed/planned. In summary, the patient has excellent coverage of his prostate with a slightly higher than average risk for late rectal toxicity.

## 2012-04-03 ENCOUNTER — Encounter: Payer: Self-pay | Admitting: *Deleted

## 2013-12-17 IMAGING — CR DG CHEST 2V
2 series · 2 of 2 positions shown · non-contrast
Comparison: None.

CLINICAL DATA: Preoperative evaluation.

CHEST - 2 VIEW

[w chest pa]
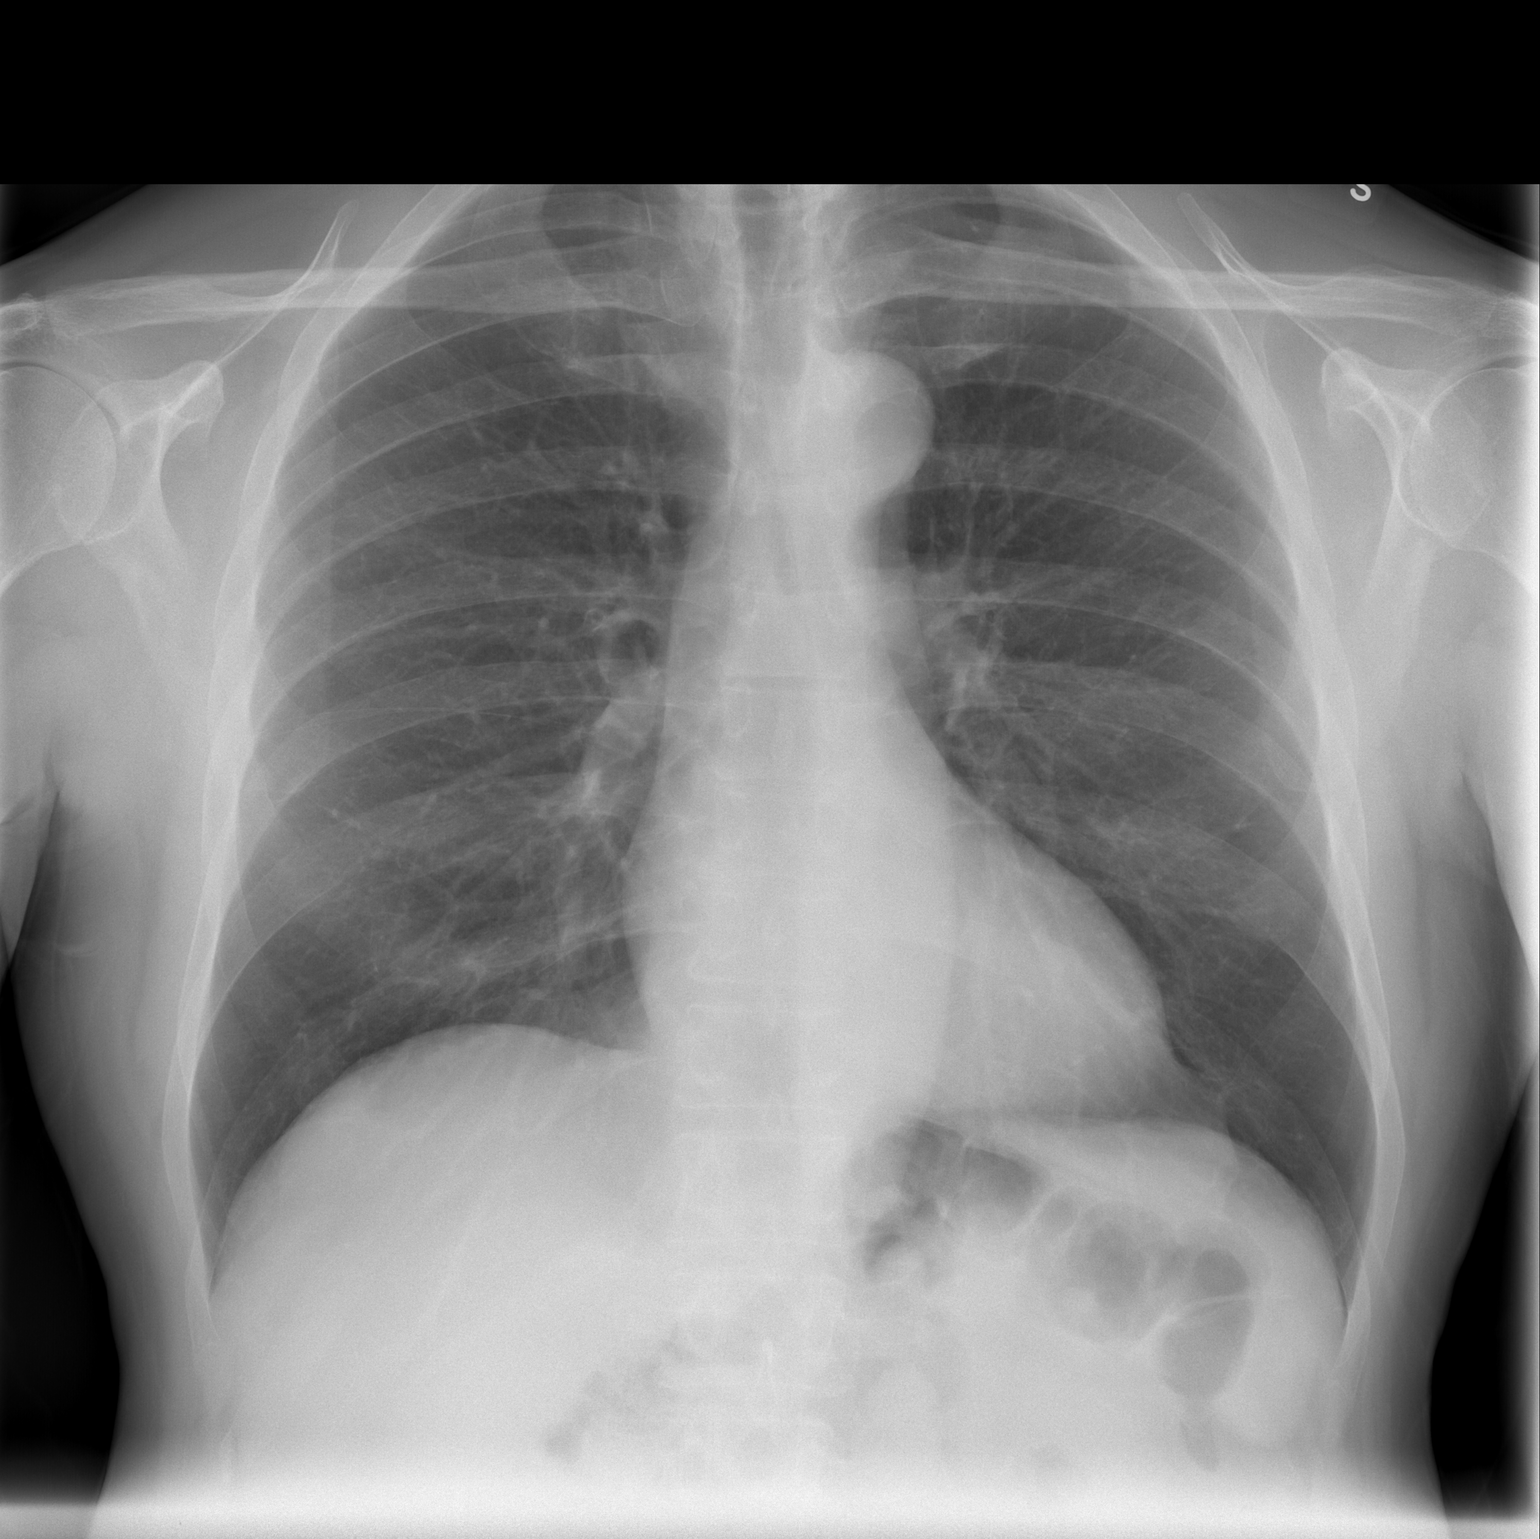

[w chest lat]
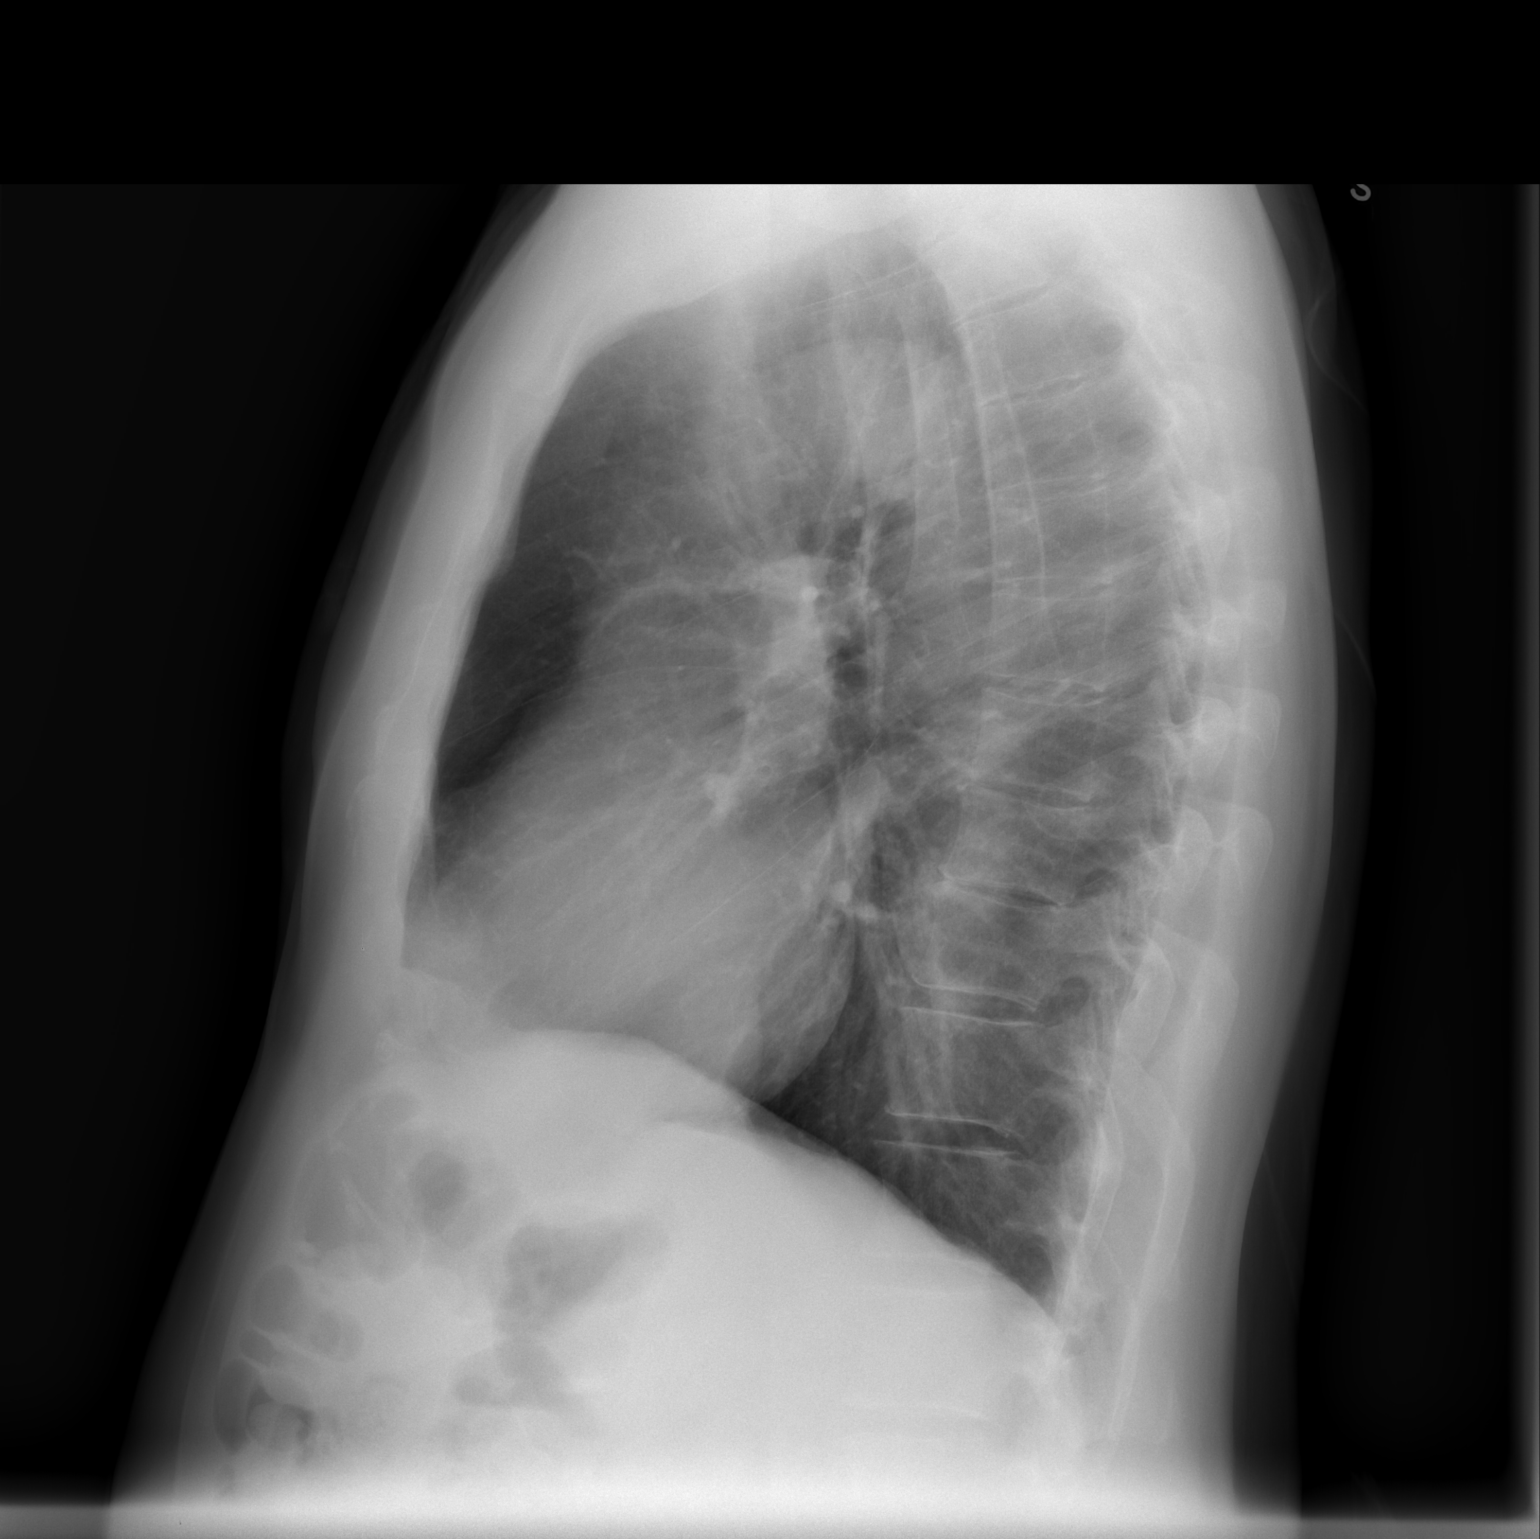

[2 of 2 positions shown; findings below may reference images not displayed]

FINDINGS: Cardiac silhouette is normal size and shape.  Slight
ectasia and tortuosity of thoracic aorta is seen.  No hilar
enlargement is evident.  There is slight flattening of the
diaphragm on lateral image.  This may reflect minimal
hyperinflation configuration.  Minimal degenerative spondylosis
changes are seen. No pleural abnormality is evident.
IMPRESSION: Slight flattening of diaphragm on lateral image may reflect minimal
hyperinflation configuration.  No pulmonary edema, pneumonia, or
other acute superimposed process is evident.

## 2016-01-19 DIAGNOSIS — C61 Malignant neoplasm of prostate: Secondary | ICD-10-CM | POA: Diagnosis not present

## 2016-01-19 DIAGNOSIS — N281 Cyst of kidney, acquired: Secondary | ICD-10-CM | POA: Diagnosis not present

## 2016-05-27 DIAGNOSIS — R69 Illness, unspecified: Secondary | ICD-10-CM | POA: Diagnosis not present

## 2016-06-07 DIAGNOSIS — Z0000 Encounter for general adult medical examination without abnormal findings: Secondary | ICD-10-CM | POA: Diagnosis not present

## 2016-06-07 DIAGNOSIS — Z79899 Other long term (current) drug therapy: Secondary | ICD-10-CM | POA: Diagnosis not present

## 2016-06-07 DIAGNOSIS — Z23 Encounter for immunization: Secondary | ICD-10-CM | POA: Diagnosis not present

## 2016-06-07 DIAGNOSIS — T887XXS Unspecified adverse effect of drug or medicament, sequela: Secondary | ICD-10-CM | POA: Diagnosis not present

## 2016-06-07 DIAGNOSIS — Z1211 Encounter for screening for malignant neoplasm of colon: Secondary | ICD-10-CM | POA: Diagnosis not present

## 2016-06-07 DIAGNOSIS — E782 Mixed hyperlipidemia: Secondary | ICD-10-CM | POA: Diagnosis not present

## 2016-06-07 DIAGNOSIS — C61 Malignant neoplasm of prostate: Secondary | ICD-10-CM | POA: Diagnosis not present

## 2016-06-27 DIAGNOSIS — Z0000 Encounter for general adult medical examination without abnormal findings: Secondary | ICD-10-CM | POA: Diagnosis not present

## 2016-06-27 DIAGNOSIS — Z6828 Body mass index (BMI) 28.0-28.9, adult: Secondary | ICD-10-CM | POA: Diagnosis not present

## 2016-06-27 DIAGNOSIS — E785 Hyperlipidemia, unspecified: Secondary | ICD-10-CM | POA: Diagnosis not present

## 2016-07-05 DIAGNOSIS — Z1211 Encounter for screening for malignant neoplasm of colon: Secondary | ICD-10-CM | POA: Diagnosis not present

## 2016-07-11 DIAGNOSIS — R55 Syncope and collapse: Secondary | ICD-10-CM | POA: Diagnosis not present

## 2016-07-11 DIAGNOSIS — Z8249 Family history of ischemic heart disease and other diseases of the circulatory system: Secondary | ICD-10-CM | POA: Diagnosis not present

## 2016-07-11 DIAGNOSIS — Z789 Other specified health status: Secondary | ICD-10-CM | POA: Diagnosis not present

## 2016-07-11 DIAGNOSIS — R079 Chest pain, unspecified: Secondary | ICD-10-CM | POA: Diagnosis not present

## 2016-07-11 DIAGNOSIS — Z7289 Other problems related to lifestyle: Secondary | ICD-10-CM | POA: Diagnosis not present

## 2016-07-11 DIAGNOSIS — S0990XA Unspecified injury of head, initial encounter: Secondary | ICD-10-CM | POA: Diagnosis not present

## 2016-07-11 DIAGNOSIS — E7800 Pure hypercholesterolemia, unspecified: Secondary | ICD-10-CM | POA: Diagnosis not present

## 2016-07-11 DIAGNOSIS — Z79899 Other long term (current) drug therapy: Secondary | ICD-10-CM | POA: Diagnosis not present

## 2016-07-11 DIAGNOSIS — E785 Hyperlipidemia, unspecified: Secondary | ICD-10-CM | POA: Diagnosis not present

## 2016-07-12 DIAGNOSIS — E785 Hyperlipidemia, unspecified: Secondary | ICD-10-CM | POA: Diagnosis not present

## 2016-07-12 DIAGNOSIS — Z789 Other specified health status: Secondary | ICD-10-CM | POA: Diagnosis not present

## 2016-07-12 DIAGNOSIS — R55 Syncope and collapse: Secondary | ICD-10-CM | POA: Diagnosis not present

## 2016-07-12 DIAGNOSIS — R079 Chest pain, unspecified: Secondary | ICD-10-CM | POA: Diagnosis not present

## 2016-07-27 DIAGNOSIS — R55 Syncope and collapse: Secondary | ICD-10-CM | POA: Diagnosis not present

## 2016-08-17 DIAGNOSIS — C61 Malignant neoplasm of prostate: Secondary | ICD-10-CM | POA: Diagnosis not present

## 2016-08-17 DIAGNOSIS — N3289 Other specified disorders of bladder: Secondary | ICD-10-CM | POA: Diagnosis not present

## 2016-10-19 DIAGNOSIS — Z881 Allergy status to other antibiotic agents status: Secondary | ICD-10-CM | POA: Diagnosis not present

## 2016-10-19 DIAGNOSIS — Z471 Aftercare following joint replacement surgery: Secondary | ICD-10-CM | POA: Diagnosis not present

## 2016-10-19 DIAGNOSIS — Z96653 Presence of artificial knee joint, bilateral: Secondary | ICD-10-CM | POA: Diagnosis not present

## 2016-10-19 DIAGNOSIS — Z96643 Presence of artificial hip joint, bilateral: Secondary | ICD-10-CM | POA: Diagnosis not present

## 2017-02-15 DIAGNOSIS — C61 Malignant neoplasm of prostate: Secondary | ICD-10-CM | POA: Diagnosis not present

## 2017-02-15 DIAGNOSIS — N400 Enlarged prostate without lower urinary tract symptoms: Secondary | ICD-10-CM | POA: Diagnosis not present

## 2017-04-24 DIAGNOSIS — H04123 Dry eye syndrome of bilateral lacrimal glands: Secondary | ICD-10-CM | POA: Diagnosis not present

## 2017-06-30 DIAGNOSIS — M542 Cervicalgia: Secondary | ICD-10-CM | POA: Diagnosis not present

## 2017-06-30 DIAGNOSIS — Z9181 History of falling: Secondary | ICD-10-CM | POA: Diagnosis not present

## 2017-06-30 DIAGNOSIS — Z6827 Body mass index (BMI) 27.0-27.9, adult: Secondary | ICD-10-CM | POA: Diagnosis not present

## 2017-06-30 DIAGNOSIS — Z79899 Other long term (current) drug therapy: Secondary | ICD-10-CM | POA: Diagnosis not present

## 2017-06-30 DIAGNOSIS — Z0000 Encounter for general adult medical examination without abnormal findings: Secondary | ICD-10-CM | POA: Diagnosis not present

## 2017-06-30 DIAGNOSIS — M25511 Pain in right shoulder: Secondary | ICD-10-CM | POA: Diagnosis not present

## 2017-06-30 DIAGNOSIS — Z1331 Encounter for screening for depression: Secondary | ICD-10-CM | POA: Diagnosis not present

## 2017-06-30 DIAGNOSIS — I6529 Occlusion and stenosis of unspecified carotid artery: Secondary | ICD-10-CM | POA: Diagnosis not present

## 2017-06-30 DIAGNOSIS — Z23 Encounter for immunization: Secondary | ICD-10-CM | POA: Diagnosis not present

## 2017-06-30 DIAGNOSIS — G8929 Other chronic pain: Secondary | ICD-10-CM | POA: Diagnosis not present

## 2017-07-12 DIAGNOSIS — M2560 Stiffness of unspecified joint, not elsewhere classified: Secondary | ICD-10-CM | POA: Diagnosis not present

## 2017-07-12 DIAGNOSIS — M25611 Stiffness of right shoulder, not elsewhere classified: Secondary | ICD-10-CM | POA: Diagnosis not present

## 2017-07-12 DIAGNOSIS — M542 Cervicalgia: Secondary | ICD-10-CM | POA: Diagnosis not present

## 2017-07-12 DIAGNOSIS — M25511 Pain in right shoulder: Secondary | ICD-10-CM | POA: Diagnosis not present

## 2017-07-27 DIAGNOSIS — M25611 Stiffness of right shoulder, not elsewhere classified: Secondary | ICD-10-CM | POA: Diagnosis not present

## 2017-07-27 DIAGNOSIS — M2560 Stiffness of unspecified joint, not elsewhere classified: Secondary | ICD-10-CM | POA: Diagnosis not present

## 2017-07-27 DIAGNOSIS — M542 Cervicalgia: Secondary | ICD-10-CM | POA: Diagnosis not present

## 2017-07-27 DIAGNOSIS — M25511 Pain in right shoulder: Secondary | ICD-10-CM | POA: Diagnosis not present

## 2017-08-02 DIAGNOSIS — M25611 Stiffness of right shoulder, not elsewhere classified: Secondary | ICD-10-CM | POA: Diagnosis not present

## 2017-08-02 DIAGNOSIS — M25511 Pain in right shoulder: Secondary | ICD-10-CM | POA: Diagnosis not present

## 2017-08-02 DIAGNOSIS — M542 Cervicalgia: Secondary | ICD-10-CM | POA: Diagnosis not present

## 2017-08-02 DIAGNOSIS — M2560 Stiffness of unspecified joint, not elsewhere classified: Secondary | ICD-10-CM | POA: Diagnosis not present

## 2017-08-11 DIAGNOSIS — M2560 Stiffness of unspecified joint, not elsewhere classified: Secondary | ICD-10-CM | POA: Diagnosis not present

## 2017-08-11 DIAGNOSIS — M25511 Pain in right shoulder: Secondary | ICD-10-CM | POA: Diagnosis not present

## 2017-08-11 DIAGNOSIS — M25611 Stiffness of right shoulder, not elsewhere classified: Secondary | ICD-10-CM | POA: Diagnosis not present

## 2017-08-11 DIAGNOSIS — M542 Cervicalgia: Secondary | ICD-10-CM | POA: Diagnosis not present

## 2017-08-16 DIAGNOSIS — M2560 Stiffness of unspecified joint, not elsewhere classified: Secondary | ICD-10-CM | POA: Diagnosis not present

## 2017-08-16 DIAGNOSIS — M542 Cervicalgia: Secondary | ICD-10-CM | POA: Diagnosis not present

## 2017-08-16 DIAGNOSIS — M25511 Pain in right shoulder: Secondary | ICD-10-CM | POA: Diagnosis not present

## 2017-08-16 DIAGNOSIS — M25611 Stiffness of right shoulder, not elsewhere classified: Secondary | ICD-10-CM | POA: Diagnosis not present

## 2017-08-21 DIAGNOSIS — N400 Enlarged prostate without lower urinary tract symptoms: Secondary | ICD-10-CM | POA: Diagnosis not present

## 2017-08-21 DIAGNOSIS — C61 Malignant neoplasm of prostate: Secondary | ICD-10-CM | POA: Diagnosis not present

## 2017-10-11 ENCOUNTER — Encounter: Payer: Self-pay | Admitting: Gastroenterology

## 2017-11-07 DIAGNOSIS — M7632 Iliotibial band syndrome, left leg: Secondary | ICD-10-CM | POA: Diagnosis not present

## 2017-11-07 DIAGNOSIS — Z6829 Body mass index (BMI) 29.0-29.9, adult: Secondary | ICD-10-CM | POA: Diagnosis not present

## 2017-11-07 DIAGNOSIS — M25562 Pain in left knee: Secondary | ICD-10-CM | POA: Diagnosis not present

## 2018-02-19 DIAGNOSIS — N400 Enlarged prostate without lower urinary tract symptoms: Secondary | ICD-10-CM | POA: Diagnosis not present

## 2018-02-19 DIAGNOSIS — C61 Malignant neoplasm of prostate: Secondary | ICD-10-CM | POA: Diagnosis not present

## 2018-02-19 DIAGNOSIS — Z7689 Persons encountering health services in other specified circumstances: Secondary | ICD-10-CM | POA: Diagnosis not present

## 2018-07-05 DIAGNOSIS — E782 Mixed hyperlipidemia: Secondary | ICD-10-CM | POA: Diagnosis not present

## 2018-08-09 ENCOUNTER — Encounter: Payer: Self-pay | Admitting: Gastroenterology

## 2018-08-23 DIAGNOSIS — Z471 Aftercare following joint replacement surgery: Secondary | ICD-10-CM | POA: Diagnosis not present

## 2018-08-23 DIAGNOSIS — Z96649 Presence of unspecified artificial hip joint: Secondary | ICD-10-CM | POA: Diagnosis not present

## 2018-08-23 DIAGNOSIS — Z96643 Presence of artificial hip joint, bilateral: Secondary | ICD-10-CM | POA: Diagnosis not present

## 2018-08-23 DIAGNOSIS — N281 Cyst of kidney, acquired: Secondary | ICD-10-CM | POA: Diagnosis not present

## 2018-08-23 DIAGNOSIS — X58XXXD Exposure to other specified factors, subsequent encounter: Secondary | ICD-10-CM | POA: Diagnosis not present

## 2018-08-23 DIAGNOSIS — C61 Malignant neoplasm of prostate: Secondary | ICD-10-CM | POA: Diagnosis not present

## 2018-08-23 DIAGNOSIS — Z96642 Presence of left artificial hip joint: Secondary | ICD-10-CM | POA: Diagnosis not present

## 2018-08-23 DIAGNOSIS — T8484XD Pain due to internal orthopedic prosthetic devices, implants and grafts, subsequent encounter: Secondary | ICD-10-CM | POA: Diagnosis not present

## 2018-08-27 DIAGNOSIS — Z8546 Personal history of malignant neoplasm of prostate: Secondary | ICD-10-CM | POA: Diagnosis not present

## 2018-08-27 DIAGNOSIS — R739 Hyperglycemia, unspecified: Secondary | ICD-10-CM | POA: Diagnosis not present

## 2018-08-27 DIAGNOSIS — Z79899 Other long term (current) drug therapy: Secondary | ICD-10-CM | POA: Diagnosis not present

## 2018-08-27 DIAGNOSIS — E782 Mixed hyperlipidemia: Secondary | ICD-10-CM | POA: Diagnosis not present

## 2018-08-27 DIAGNOSIS — Z6826 Body mass index (BMI) 26.0-26.9, adult: Secondary | ICD-10-CM | POA: Diagnosis not present

## 2018-08-27 DIAGNOSIS — E663 Overweight: Secondary | ICD-10-CM | POA: Diagnosis not present

## 2018-08-27 DIAGNOSIS — I6529 Occlusion and stenosis of unspecified carotid artery: Secondary | ICD-10-CM | POA: Diagnosis not present

## 2018-08-29 ENCOUNTER — Encounter: Payer: Self-pay | Admitting: Gastroenterology

## 2018-08-29 ENCOUNTER — Ambulatory Visit (AMBULATORY_SURGERY_CENTER): Payer: Self-pay | Admitting: *Deleted

## 2018-08-29 ENCOUNTER — Other Ambulatory Visit: Payer: Self-pay

## 2018-08-29 VITALS — Ht 68.0 in | Wt 172.8 lb

## 2018-08-29 DIAGNOSIS — Z800 Family history of malignant neoplasm of digestive organs: Secondary | ICD-10-CM

## 2018-08-29 MED ORDER — SUPREP BOWEL PREP KIT 17.5-3.13-1.6 GM/177ML PO SOLN: 1.0000 | Freq: Once | ORAL | 0 refills | Status: AC

## 2018-08-29 NOTE — Progress Notes (Signed)
No egg or soy allergy known to patient  No issues with past sedation with any surgeries  or procedures, no intubation problems  No diet pills per patient No home 02 use per patient  No blood thinners per patient  Pt denies issues with constipation  No A fib or A flutter  EMMI video sent to pt's e mail  No more than 50.00 suprep coupon given

## 2018-08-31 DIAGNOSIS — N281 Cyst of kidney, acquired: Secondary | ICD-10-CM | POA: Diagnosis not present

## 2018-09-12 ENCOUNTER — Ambulatory Visit (AMBULATORY_SURGERY_CENTER): Payer: Medicare HMO | Admitting: Gastroenterology

## 2018-09-12 ENCOUNTER — Encounter: Payer: Self-pay | Admitting: Gastroenterology

## 2018-09-12 VITALS — BP 111/66 | HR 57 | Temp 96.2°F | Resp 13

## 2018-09-12 DIAGNOSIS — Z86010 Personal history of colonic polyps: Secondary | ICD-10-CM | POA: Diagnosis not present

## 2018-09-12 DIAGNOSIS — K6289 Other specified diseases of anus and rectum: Secondary | ICD-10-CM | POA: Diagnosis not present

## 2018-09-12 DIAGNOSIS — D123 Benign neoplasm of transverse colon: Secondary | ICD-10-CM | POA: Diagnosis not present

## 2018-09-12 DIAGNOSIS — Z1211 Encounter for screening for malignant neoplasm of colon: Secondary | ICD-10-CM

## 2018-09-12 DIAGNOSIS — D120 Benign neoplasm of cecum: Secondary | ICD-10-CM | POA: Diagnosis not present

## 2018-09-12 DIAGNOSIS — Z800 Family history of malignant neoplasm of digestive organs: Secondary | ICD-10-CM | POA: Diagnosis not present

## 2018-09-12 MED ORDER — SODIUM CHLORIDE 0.9 % IV SOLN: 500.0000 mL | Freq: Once | INTRAVENOUS | Status: DC

## 2018-09-12 NOTE — Progress Notes (Signed)
Pt's states no medical or surgical changes since previsit or office visit. 

## 2018-09-12 NOTE — Op Note (Signed)
[facility] Patient Name: [name] Procedure Date: 09/12/2018 10:36 AM MRN: [id number] Endoscopist: Jackquline Denmark , MD Age: 73 Referring MD:  Date of Birth: Sep 11, 1945 Gender: Male Account #: [id number] Procedure:                Colonoscopy Indications:              Screening in patient at increased risk: Colorectal                            cancer in mother 63 or older Medicines:                Monitored Anesthesia Care Procedure:                Pre-Anesthesia Assessment:                           - Prior to the procedure, a History and Physical                            was performed, and patient medications and                            allergies were reviewed. The patient's tolerance of                            previous anesthesia was also reviewed. The risks                            and benefits of the procedure and the sedation                            options and risks were discussed with the patient.                            All questions were answered, and informed consent                            was obtained. Prior Anticoagulants: The patient has                            taken no previous anticoagulant or antiplatelet                            agents. ASA Grade Assessment: II - A patient with                            mild systemic disease. After reviewing the risks                            and benefits, the patient was deemed in                            satisfactory condition to undergo the procedure.  After obtaining informed consent, the colonoscope                            was passed under direct vision. Throughout the                            procedure, the patient's blood pressure, pulse, and                            oxygen saturations were monitored continuously. The                            Colonoscope was introduced through the anus and                            advanced to the 2 cm into the ileum.  The                            colonoscopy was performed without difficulty. The                            patient tolerated the procedure well. The quality                            of the bowel preparation was excellent. Scope In: 10:48:59 AM Scope Out: 11:03:56 AM Scope Withdrawal Time: 0 hours 10 minutes 59 seconds  Total Procedure Duration: 0 hours 14 minutes 57 seconds  Findings:                 Three sessile polyps were found in the proximal                            transverse colon and cecum. The polyps were 4 to 6                            mm in size. These polyps were removed with a cold                            snare. Resection and retrieval were complete.                            Estimated blood loss: none.                           A few small-mouthed diverticula were found in the                            sigmoid colon.                           Non-bleeding internal hemorrhoids were found. The                            hemorrhoids were small. Healed posterior anal  fissure was also noted on rectal examination.                           The exam was otherwise without abnormality on                            direct and retroflexion views.                           The terminal ileum appeared normal. Complications:            No immediate complications. Estimated Blood Loss:     Estimated blood loss: none. Impression:               -Three 4 to 6 mm polyps in the proximal transverse                            colon and in the cecum, removed with a cold snare.                            Resected and retrieved.                           -Mild sigmoid diverticulosis.                           -Non-bleeding internal hemorrhoids.                           -Otherwise normal colonoscopy to TI. Recommendation:           - Patient has a contact number available for                            emergencies. The signs and symptoms of potential                             delayed complications were discussed with the                            patient. Return to normal activities tomorrow.                            Written discharge instructions were provided to the                            patient.                           - High fiber diet.                           - Continue present medications.                           - Await pathology results.                           -  Repeat colonoscopy for surveillance based on                            pathology results.                           - Return to GI office PRN. Jackquline Denmark, MD 09/12/2018 11:08:16 AM This report has been signed electronically.

## 2018-09-12 NOTE — Progress Notes (Signed)
Report given to PACU, vss 

## 2018-09-12 NOTE — Progress Notes (Signed)
Called to room to assist during endoscopic procedure.  Patient ID and intended procedure confirmed with present staff. Received instructions for my participation in the procedure from the performing physician.  

## 2018-09-12 NOTE — Patient Instructions (Signed)
   INFORMATION ON POLYPS ,DIVERTICULOSIS ,AND HEMORRHOIDS GIVEN TO YOU TODAY  PER DR GUPTA-FOLLOW HIGH FIBER DIET-HANDOUT GIVEN TO YOU TODAY   YOU HAD AN ENDOSCOPIC PROCEDURE TODAY AT [facility]:   Refer to the procedure report that was given to you for any specific questions about what was found during the examination.  If the procedure report does not answer your questions, please call your gastroenterologist to clarify.  If you requested that your care partner not be given the details of your procedure findings, then the procedure report has been included in a sealed envelope for you to review at your convenience later.  YOU SHOULD EXPECT: Some feelings of bloating in the abdomen. Passage of more gas than usual.  Walking can help get rid of the air that was put into your GI tract during the procedure and reduce the bloating. If you had a lower endoscopy (such as a colonoscopy or flexible sigmoidoscopy) you may notice spotting of blood in your stool or on the toilet paper. If you underwent a bowel prep for your procedure, you may not have a normal bowel movement for a few days.  Please Note:  You might notice some irritation and congestion in your nose or some drainage.  This is from the oxygen used during your procedure.  There is no need for concern and it should clear up in a day or so.  SYMPTOMS TO REPORT IMMEDIATELY:   Following lower endoscopy (colonoscopy or flexible sigmoidoscopy):  Excessive amounts of blood in the stool  Significant tenderness or worsening of abdominal pains  Swelling of the abdomen that is new, acute  Fever of 100F or higher    For urgent or emergent issues, a gastroenterologist can be reached at any hour by calling [phone].   DIET:  We do recommend a small meal at first, but then you may proceed to your regular diet.  Drink plenty of fluids but you should avoid alcoholic beverages for 24 hours.  ACTIVITY:  You should plan to take it  easy for the rest of today and you should NOT DRIVE or use heavy machinery until tomorrow (because of the sedation medicines used during the test).    FOLLOW UP: Our staff will call the number listed on your records the next business day following your procedure to check on you and address any questions or concerns that you may have regarding the information given to you following your procedure. If we do not reach you, we will leave a message.  However, if you are feeling well and you are not experiencing any problems, there is no need to return our call.  We will assume that you have returned to your regular daily activities without incident.  If any biopsies were taken you will be contacted by phone or by letter within the next 1-3 weeks.  Please call us at [phone] if you have not heard about the biopsies in 3 weeks.    SIGNATURES/CONFIDENTIALITY: You and/or your care partner have signed paperwork which will be entered into your electronic medical record.  These signatures attest to the fact that that the information above on your After Visit Summary has been reviewed and is understood.  Full responsibility of the confidentiality of this discharge information lies with you and/or your care-partner.

## 2018-09-13 ENCOUNTER — Telehealth: Payer: Self-pay

## 2018-09-13 ENCOUNTER — Telehealth: Payer: Self-pay | Admitting: *Deleted

## 2018-09-13 NOTE — Telephone Encounter (Signed)
Called #[phone] and left a messaged we tried to reach pt for a follow up call. maw

## 2018-09-13 NOTE — Telephone Encounter (Signed)
  Follow up Call-  Call back number 09/12/2018  Post procedure Call Back phone  # [phone]  Permission to leave phone message Yes  Some recent data might be hidden    [facility]

## 2018-09-13 NOTE — Telephone Encounter (Signed)
  Follow up Call-  Call back number 09/12/2018  Post procedure Call Back phone  # [phone]  Permission to leave phone message Yes  Some recent data might be hidden     Patient questions:  Do you have a fever, pain , or abdominal swelling? No. Pain Score  0 *  Have you tolerated food without any problems? Yes.    Have you been able to return to your normal activities? Yes.    Do you have any questions about your discharge instructions: Diet   No. Medications  No. Follow up visit  No.  Do you have questions or concerns about your Care? No.  Actions: * If pain score is 4 or above: No action needed, pain <4.

## 2018-09-14 ENCOUNTER — Encounter: Payer: Self-pay | Admitting: Gastroenterology

## 2019-01-22 DIAGNOSIS — N400 Enlarged prostate without lower urinary tract symptoms: Secondary | ICD-10-CM | POA: Diagnosis not present

## 2019-01-22 DIAGNOSIS — K4090 Unilateral inguinal hernia, without obstruction or gangrene, not specified as recurrent: Secondary | ICD-10-CM | POA: Diagnosis not present

## 2019-01-22 DIAGNOSIS — C61 Malignant neoplasm of prostate: Secondary | ICD-10-CM | POA: Diagnosis not present

## 2019-01-23 DIAGNOSIS — K4090 Unilateral inguinal hernia, without obstruction or gangrene, not specified as recurrent: Secondary | ICD-10-CM | POA: Diagnosis not present

## 2019-01-28 DIAGNOSIS — K219 Gastro-esophageal reflux disease without esophagitis: Secondary | ICD-10-CM | POA: Diagnosis not present

## 2019-01-28 DIAGNOSIS — Z79899 Other long term (current) drug therapy: Secondary | ICD-10-CM | POA: Diagnosis not present

## 2019-01-28 DIAGNOSIS — E785 Hyperlipidemia, unspecified: Secondary | ICD-10-CM | POA: Diagnosis not present

## 2019-01-28 DIAGNOSIS — Z7982 Long term (current) use of aspirin: Secondary | ICD-10-CM | POA: Diagnosis not present

## 2019-01-28 DIAGNOSIS — K4090 Unilateral inguinal hernia, without obstruction or gangrene, not specified as recurrent: Secondary | ICD-10-CM | POA: Diagnosis not present

## 2019-02-12 DIAGNOSIS — Z09 Encounter for follow-up examination after completed treatment for conditions other than malignant neoplasm: Secondary | ICD-10-CM | POA: Diagnosis not present

## 2019-04-02 DIAGNOSIS — R69 Illness, unspecified: Secondary | ICD-10-CM | POA: Diagnosis not present

## 2019-04-30 DIAGNOSIS — Z881 Allergy status to other antibiotic agents status: Secondary | ICD-10-CM | POA: Diagnosis not present

## 2019-04-30 DIAGNOSIS — Z8546 Personal history of malignant neoplasm of prostate: Secondary | ICD-10-CM | POA: Diagnosis not present

## 2019-04-30 DIAGNOSIS — Z96649 Presence of unspecified artificial hip joint: Secondary | ICD-10-CM | POA: Diagnosis not present

## 2019-04-30 DIAGNOSIS — E785 Hyperlipidemia, unspecified: Secondary | ICD-10-CM | POA: Diagnosis not present

## 2019-04-30 DIAGNOSIS — Z809 Family history of malignant neoplasm, unspecified: Secondary | ICD-10-CM | POA: Diagnosis not present

## 2019-06-24 DIAGNOSIS — J329 Chronic sinusitis, unspecified: Secondary | ICD-10-CM | POA: Diagnosis not present

## 2019-06-24 DIAGNOSIS — E782 Mixed hyperlipidemia: Secondary | ICD-10-CM | POA: Diagnosis not present

## 2019-06-24 DIAGNOSIS — J40 Bronchitis, not specified as acute or chronic: Secondary | ICD-10-CM | POA: Diagnosis not present

## 2019-07-08 DIAGNOSIS — R69 Illness, unspecified: Secondary | ICD-10-CM | POA: Diagnosis not present

## 2019-07-24 DIAGNOSIS — N400 Enlarged prostate without lower urinary tract symptoms: Secondary | ICD-10-CM | POA: Diagnosis not present

## 2019-07-24 DIAGNOSIS — C61 Malignant neoplasm of prostate: Secondary | ICD-10-CM | POA: Diagnosis not present

## 2019-07-29 DIAGNOSIS — Z79899 Other long term (current) drug therapy: Secondary | ICD-10-CM | POA: Diagnosis not present

## 2019-07-29 DIAGNOSIS — Z23 Encounter for immunization: Secondary | ICD-10-CM | POA: Diagnosis not present

## 2019-07-29 DIAGNOSIS — E782 Mixed hyperlipidemia: Secondary | ICD-10-CM | POA: Diagnosis not present

## 2019-07-29 DIAGNOSIS — Z0000 Encounter for general adult medical examination without abnormal findings: Secondary | ICD-10-CM | POA: Diagnosis not present

## 2019-07-29 DIAGNOSIS — R739 Hyperglycemia, unspecified: Secondary | ICD-10-CM | POA: Diagnosis not present

## 2019-07-29 DIAGNOSIS — Z8546 Personal history of malignant neoplasm of prostate: Secondary | ICD-10-CM | POA: Diagnosis not present

## 2019-07-29 DIAGNOSIS — E663 Overweight: Secondary | ICD-10-CM | POA: Diagnosis not present

## 2019-07-29 DIAGNOSIS — Z6825 Body mass index (BMI) 25.0-25.9, adult: Secondary | ICD-10-CM | POA: Diagnosis not present

## 2019-07-29 DIAGNOSIS — M1712 Unilateral primary osteoarthritis, left knee: Secondary | ICD-10-CM | POA: Diagnosis not present

## 2019-07-29 DIAGNOSIS — I6529 Occlusion and stenosis of unspecified carotid artery: Secondary | ICD-10-CM | POA: Diagnosis not present

## 2019-09-30 DIAGNOSIS — R69 Illness, unspecified: Secondary | ICD-10-CM | POA: Diagnosis not present

## 2019-12-07 DIAGNOSIS — Z8249 Family history of ischemic heart disease and other diseases of the circulatory system: Secondary | ICD-10-CM | POA: Diagnosis not present

## 2019-12-07 DIAGNOSIS — I951 Orthostatic hypotension: Secondary | ICD-10-CM | POA: Diagnosis not present

## 2019-12-07 DIAGNOSIS — Z008 Encounter for other general examination: Secondary | ICD-10-CM | POA: Diagnosis not present

## 2019-12-07 DIAGNOSIS — I739 Peripheral vascular disease, unspecified: Secondary | ICD-10-CM | POA: Diagnosis not present

## 2019-12-07 DIAGNOSIS — G3184 Mild cognitive impairment, so stated: Secondary | ICD-10-CM | POA: Diagnosis not present

## 2019-12-07 DIAGNOSIS — Z8546 Personal history of malignant neoplasm of prostate: Secondary | ICD-10-CM | POA: Diagnosis not present

## 2019-12-07 DIAGNOSIS — R030 Elevated blood-pressure reading, without diagnosis of hypertension: Secondary | ICD-10-CM | POA: Diagnosis not present

## 2019-12-07 DIAGNOSIS — Z96649 Presence of unspecified artificial hip joint: Secondary | ICD-10-CM | POA: Diagnosis not present

## 2019-12-07 DIAGNOSIS — Z881 Allergy status to other antibiotic agents status: Secondary | ICD-10-CM | POA: Diagnosis not present

## 2019-12-07 DIAGNOSIS — Z833 Family history of diabetes mellitus: Secondary | ICD-10-CM | POA: Diagnosis not present

## 2019-12-07 DIAGNOSIS — E785 Hyperlipidemia, unspecified: Secondary | ICD-10-CM | POA: Diagnosis not present

## 2019-12-12 DIAGNOSIS — E782 Mixed hyperlipidemia: Secondary | ICD-10-CM | POA: Diagnosis not present

## 2019-12-12 DIAGNOSIS — Z8546 Personal history of malignant neoplasm of prostate: Secondary | ICD-10-CM | POA: Diagnosis not present

## 2019-12-12 DIAGNOSIS — I6529 Occlusion and stenosis of unspecified carotid artery: Secondary | ICD-10-CM | POA: Diagnosis not present

## 2019-12-12 DIAGNOSIS — I739 Peripheral vascular disease, unspecified: Secondary | ICD-10-CM | POA: Diagnosis not present

## 2019-12-12 DIAGNOSIS — Z6826 Body mass index (BMI) 26.0-26.9, adult: Secondary | ICD-10-CM | POA: Diagnosis not present

## 2019-12-12 DIAGNOSIS — L570 Actinic keratosis: Secondary | ICD-10-CM | POA: Diagnosis not present

## 2019-12-12 DIAGNOSIS — E663 Overweight: Secondary | ICD-10-CM | POA: Diagnosis not present

## 2019-12-25 DIAGNOSIS — L814 Other melanin hyperpigmentation: Secondary | ICD-10-CM | POA: Diagnosis not present

## 2019-12-25 DIAGNOSIS — L578 Other skin changes due to chronic exposure to nonionizing radiation: Secondary | ICD-10-CM | POA: Diagnosis not present

## 2019-12-25 DIAGNOSIS — L570 Actinic keratosis: Secondary | ICD-10-CM | POA: Diagnosis not present

## 2019-12-25 DIAGNOSIS — D485 Neoplasm of uncertain behavior of skin: Secondary | ICD-10-CM | POA: Diagnosis not present

## 2020-01-07 ENCOUNTER — Other Ambulatory Visit: Payer: Self-pay | Admitting: *Deleted

## 2020-01-07 DIAGNOSIS — I739 Peripheral vascular disease, unspecified: Secondary | ICD-10-CM

## 2020-01-16 ENCOUNTER — Ambulatory Visit (HOSPITAL_COMMUNITY)
Admission: RE | Admit: 2020-01-16 | Discharge: 2020-01-16 | Disposition: A | Discharge status: Home / Self Care | Payer: Medicare HMO | Source: Ambulatory Visit | Attending: Vascular Surgery | Admitting: Vascular Surgery

## 2020-01-16 ENCOUNTER — Ambulatory Visit (INDEPENDENT_AMBULATORY_CARE_PROVIDER_SITE_OTHER): Payer: Medicare HMO | Admitting: Vascular Surgery

## 2020-01-16 ENCOUNTER — Encounter: Payer: Self-pay | Admitting: Vascular Surgery

## 2020-01-16 ENCOUNTER — Other Ambulatory Visit: Payer: Self-pay

## 2020-01-16 VITALS — BP 135/84 | HR 60 | Temp 98.1°F | Resp 20 | Ht 68.0 in | Wt 170.0 lb

## 2020-01-16 DIAGNOSIS — I83813 Varicose veins of bilateral lower extremities with pain: Secondary | ICD-10-CM | POA: Diagnosis not present

## 2020-01-16 DIAGNOSIS — I739 Peripheral vascular disease, unspecified: Secondary | ICD-10-CM | POA: Diagnosis not present

## 2020-01-16 NOTE — Progress Notes (Signed)
Referring Physician: Dr. Venetia Maxon  Patient name: [name] MRN: [id number] DOB: [birth year] Sex: male  REASON FOR CONSULT: Peripheral arterial disease on home screening  HPI: [name] is a 74 y.o. male, referred for evaluation after recent home screening for peripheral arterial disease.  Patient has no claudication symptoms.  He is able to walk 2 miles without any problems in his legs.  He does have varicose veins have been present for several years but he has no symptoms of heaviness fullness or aching in these.  He does not smoke.  His only risk factor for atherosclerosis is elevated cholesterol.  Past Medical History:  Diagnosis Date  . Anemia 2009   transfusion post-op  . Arthritis   . Blood transfusion without reported diagnosis   . BPH (benign prostatic hyperplasia)   . Elevated prostate specific antigen (PSA)    11/23/11 PSA 4.7  . Hypercholesterolemia   . Prostate cancer ([place]) 04/20/2010   Gleason 3+3=6, vol 30 gm  . Prostatitis   . 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 COLONOSCOPY    . JOINT REPLACEMENT    . PROSTATE BIOPSY  04/20/2010   Gleason 3+3=6, vol 30 cc  . RADIOACTIVE SEED IMPLANT  01/11/2012   Procedure: RADIOACTIVE SEED IMPLANT;  Surgeon: Joie Bimler, MD;  Location: [facility];  Service: Urology;  Laterality: N/A;  RAD TECH OK PER VICKI AT MAIN   . TOTAL HIP ARTHROPLASTY  2009   right hip resurfacing  . TOTAL HIP ARTHROPLASTY  09/2010   left hip resurfacing at [facility] History  Problem Relation Age of Onset  . Cancer Mother 20       colon  . Diabetes Mother   . Colon cancer Mother   . Hypertension Father   . Cancer Sister        cancer of the uterus  . Heart disease Brother   . Colon polyps Brother   . Stroke Maternal Grandmother   . Heart disease Paternal Grandfather   . Heart disease Brother   . Colon cancer Maternal Uncle   .  Esophageal cancer Neg Hx   . Stomach cancer Neg Hx   . Rectal cancer Neg Hx     SOCIAL HISTORY: Social History   Socioeconomic History  . Marital status: Married    Spouse name: Not on file  . Number of children: Not on file  . Years of education: Not on file  . Highest education level: Not on file  Occupational History  . Not on file  Tobacco Use  . Smoking status: Never Smoker  . Smokeless tobacco: Never Used  Vaping Use  . Vaping Use: Never used  Substance and Sexual Activity  . Alcohol use: Yes    Alcohol/week: 1.0 - 2.0 standard drink    Types: 1 - 2 Shots of liquor per week    Comment: occassional  . Drug use: No  . Sexual activity: Not on file  Other Topics Concern  . Not on file  Social History Narrative   Married, 3 children      11/23/11 PSA 4.7   Social Determinants of Health   Financial Resource Strain:   . Difficulty of Paying Living Expenses:   Food Insecurity:   . Worried About Charity fundraiser in the Last Year:   . [place] in the Last Year:  Transportation Needs:   . Film/video editor (Medical):   Marland Kitchen Lack of Transportation (Non-Medical):   Physical Activity:   . Days of Exercise per Week:   . Minutes of Exercise per Session:   Stress:   . Feeling of Stress :   Social Connections:   . Frequency of Communication with Friends and Family:   . Frequency of Social Gatherings with Friends and Family:   . Attends Religious Services:   . Active Member of Clubs or Organizations:   . Attends Archivist Meetings:   Marland Kitchen Marital Status:   Intimate Partner Violence:   . Fear of Current or Ex-Partner:   . Emotionally Abused:   Marland Kitchen Physically Abused:   . Sexually Abused:     Allergies  Allergen Reactions  . Ciprofloxacin Itching    Current Outpatient Medications  Medication Sig Dispense Refill  . amoxicillin (AMOXIL) 500 MG capsule TAKE 4 CAPSULES BY MOUTH 1 HOUR PRIOR TO PROCEDURE    . Ascorbic Acid (VITAMIN C) 1000 MG tablet  Take 1,000 mg by mouth daily.    . cholecalciferol (VITAMIN D) 1000 UNITS tablet Take 1,000 Units by mouth daily.    . Coenzyme Q10 (COQ10) 100 MG CAPS     . Flaxseed Oil (LINSEED OIL) OIL     . Multiple Vitamins-Minerals (SOLO) TABS     . rosuvastatin (CRESTOR) 10 MG tablet     . fish oil-omega-3 fatty acids 1000 MG capsule Take 2 g by mouth daily. (Patient not taking: Reported on 01/16/2020)    . Lopinavir-Ritonavir (KALETRA PO) Take by mouth. (Patient not taking: Reported on 01/16/2020)    . Omega-3 1000 MG CAPS  (Patient not taking: Reported on 01/16/2020)     No current facility-administered medications for this visit.    ROS:   General:  No weight loss, Fever, chills  HEENT: No recent headaches, no nasal bleeding, no visual changes, no sore throat  Neurologic: No dizziness, blackouts, seizures. No recent symptoms of stroke or mini- stroke. No recent episodes of slurred speech, or temporary blindness.  Cardiac: No recent episodes of chest pain/pressure, no shortness of breath at rest.  No shortness of breath with exertion.  Denies history of atrial fibrillation or irregular heartbeat  Vascular: No history of rest pain in feet.  No history of claudication.  No history of non-healing ulcer, No history of DVT   Pulmonary: No home oxygen, no productive cough, no hemoptysis,  No asthma or wheezing  Musculoskeletal:  [X]  Arthritis, [ ]  Low back pain,  [X]  Joint pain  Hematologic:No history of hypercoagulable state.  No history of easy bleeding.  No history of anemia  Gastrointestinal: No hematochezia or melena,  No gastroesophageal reflux, no trouble swallowing  Urinary: [ ]  chronic Kidney disease, [ ]  on HD - [ ]  MWF or [ ]  TTHS, [ ]  Burning with urination, [ ]  Frequent urination, [ ]  Difficulty urinating;   Skin: No rashes  Psychological: No history of anxiety,  No history of depression   Physical Examination  Vitals:   01/16/20 1129  BP: 135/84  Pulse: 60  Resp: 20    Temp: 98.1 F (36.7 C)  SpO2: 99%  Weight: 170 lb (77.1 kg)  Height: 5\' 8"  (1.727 m)    Body mass index is 25.85 kg/m.  General:  Alert and oriented, no acute distress HEENT: Normal Cardiac: Regular Rate and Rhythm Skin: No rash or ulcer, bilateral medial calf varicose veins Extremity Pulses:  2+ radial,  brachial, absent dorsalis pedis, 2+ posterior tibial pulses bilaterally Musculoskeletal: No deformity or edema  Neurologic: Upper and lower extremity motor 5/5 and symmetric  DATA:  Patient had bilateral ABIs performed today which were greater than 1 triphasic and normal bilaterally  ASSESSMENT: No significant arterial occlusive disease.  Asymptomatic lower extremity varicose veins  PLAN: Patient will follow up with [place] on an as-needed basis.  He was told he can wear compression stockings for his lower extremities if he develops fullness heaviness or aching from his varicose veins.  I reassured him that he has no significant arterial occlusive disease.   Ruta Hinds, MD [facility] Office: [phone] Pager: [phone]

## 2020-01-21 DIAGNOSIS — N400 Enlarged prostate without lower urinary tract symptoms: Secondary | ICD-10-CM | POA: Diagnosis not present

## 2020-01-21 DIAGNOSIS — M542 Cervicalgia: Secondary | ICD-10-CM | POA: Diagnosis not present

## 2020-01-21 DIAGNOSIS — C61 Malignant neoplasm of prostate: Secondary | ICD-10-CM | POA: Diagnosis not present

## 2020-04-02 DIAGNOSIS — R69 Illness, unspecified: Secondary | ICD-10-CM | POA: Diagnosis not present

## 2020-05-01 DIAGNOSIS — R69 Illness, unspecified: Secondary | ICD-10-CM | POA: Diagnosis not present

## 2020-05-08 DIAGNOSIS — Z20828 Contact with and (suspected) exposure to other viral communicable diseases: Secondary | ICD-10-CM | POA: Diagnosis not present

## 2020-07-22 DIAGNOSIS — N400 Enlarged prostate without lower urinary tract symptoms: Secondary | ICD-10-CM | POA: Diagnosis not present

## 2020-07-22 DIAGNOSIS — C61 Malignant neoplasm of prostate: Secondary | ICD-10-CM | POA: Diagnosis not present

## 2020-08-04 DIAGNOSIS — E782 Mixed hyperlipidemia: Secondary | ICD-10-CM | POA: Diagnosis not present

## 2020-08-04 DIAGNOSIS — Z0000 Encounter for general adult medical examination without abnormal findings: Secondary | ICD-10-CM | POA: Diagnosis not present

## 2020-08-04 DIAGNOSIS — I6529 Occlusion and stenosis of unspecified carotid artery: Secondary | ICD-10-CM | POA: Diagnosis not present

## 2020-08-04 DIAGNOSIS — Z79899 Other long term (current) drug therapy: Secondary | ICD-10-CM | POA: Diagnosis not present

## 2020-08-04 DIAGNOSIS — Z8546 Personal history of malignant neoplasm of prostate: Secondary | ICD-10-CM | POA: Diagnosis not present

## 2020-08-04 DIAGNOSIS — E663 Overweight: Secondary | ICD-10-CM | POA: Diagnosis not present

## 2020-08-04 DIAGNOSIS — Z6826 Body mass index (BMI) 26.0-26.9, adult: Secondary | ICD-10-CM | POA: Diagnosis not present

## 2020-08-04 DIAGNOSIS — R7302 Impaired glucose tolerance (oral): Secondary | ICD-10-CM | POA: Diagnosis not present

## 2020-08-04 DIAGNOSIS — I70213 Atherosclerosis of native arteries of extremities with intermittent claudication, bilateral legs: Secondary | ICD-10-CM | POA: Diagnosis not present

## 2020-08-04 DIAGNOSIS — Z9181 History of falling: Secondary | ICD-10-CM | POA: Diagnosis not present

## 2021-01-08 DIAGNOSIS — Z20828 Contact with and (suspected) exposure to other viral communicable diseases: Secondary | ICD-10-CM | POA: Diagnosis not present

## 2021-01-19 DIAGNOSIS — N400 Enlarged prostate without lower urinary tract symptoms: Secondary | ICD-10-CM | POA: Diagnosis not present

## 2021-01-19 DIAGNOSIS — C61 Malignant neoplasm of prostate: Secondary | ICD-10-CM | POA: Diagnosis not present

## 2021-03-12 DIAGNOSIS — J209 Acute bronchitis, unspecified: Secondary | ICD-10-CM | POA: Diagnosis not present

## 2021-03-12 DIAGNOSIS — Z20828 Contact with and (suspected) exposure to other viral communicable diseases: Secondary | ICD-10-CM | POA: Diagnosis not present

## 2021-03-12 DIAGNOSIS — R0981 Nasal congestion: Secondary | ICD-10-CM | POA: Diagnosis not present

## 2021-03-12 DIAGNOSIS — R051 Acute cough: Secondary | ICD-10-CM | POA: Diagnosis not present

## 2021-06-10 DIAGNOSIS — E663 Overweight: Secondary | ICD-10-CM | POA: Diagnosis not present

## 2021-06-10 DIAGNOSIS — Z6826 Body mass index (BMI) 26.0-26.9, adult: Secondary | ICD-10-CM | POA: Diagnosis not present

## 2021-06-10 DIAGNOSIS — M7582 Other shoulder lesions, left shoulder: Secondary | ICD-10-CM | POA: Diagnosis not present

## 2021-07-21 DIAGNOSIS — N400 Enlarged prostate without lower urinary tract symptoms: Secondary | ICD-10-CM | POA: Diagnosis not present

## 2021-07-21 DIAGNOSIS — C61 Malignant neoplasm of prostate: Secondary | ICD-10-CM | POA: Diagnosis not present

## 2021-08-12 DIAGNOSIS — R1031 Right lower quadrant pain: Secondary | ICD-10-CM | POA: Diagnosis not present

## 2021-08-12 DIAGNOSIS — K5900 Constipation, unspecified: Secondary | ICD-10-CM | POA: Diagnosis not present

## 2021-08-12 DIAGNOSIS — R1011 Right upper quadrant pain: Secondary | ICD-10-CM | POA: Diagnosis not present

## 2021-08-17 DIAGNOSIS — Z6826 Body mass index (BMI) 26.0-26.9, adult: Secondary | ICD-10-CM | POA: Diagnosis not present

## 2021-08-17 DIAGNOSIS — I6529 Occlusion and stenosis of unspecified carotid artery: Secondary | ICD-10-CM | POA: Diagnosis not present

## 2021-08-17 DIAGNOSIS — Z79899 Other long term (current) drug therapy: Secondary | ICD-10-CM | POA: Diagnosis not present

## 2021-08-17 DIAGNOSIS — R7302 Impaired glucose tolerance (oral): Secondary | ICD-10-CM | POA: Diagnosis not present

## 2021-08-17 DIAGNOSIS — I70213 Atherosclerosis of native arteries of extremities with intermittent claudication, bilateral legs: Secondary | ICD-10-CM | POA: Diagnosis not present

## 2021-08-17 DIAGNOSIS — Z0000 Encounter for general adult medical examination without abnormal findings: Secondary | ICD-10-CM | POA: Diagnosis not present

## 2021-08-17 DIAGNOSIS — E663 Overweight: Secondary | ICD-10-CM | POA: Diagnosis not present

## 2021-08-17 DIAGNOSIS — Z9181 History of falling: Secondary | ICD-10-CM | POA: Diagnosis not present

## 2021-08-17 DIAGNOSIS — Z8546 Personal history of malignant neoplasm of prostate: Secondary | ICD-10-CM | POA: Diagnosis not present

## 2021-08-17 DIAGNOSIS — E782 Mixed hyperlipidemia: Secondary | ICD-10-CM | POA: Diagnosis not present

## 2021-11-30 ENCOUNTER — Encounter: Payer: Self-pay | Admitting: Gastroenterology

## 2022-01-19 DIAGNOSIS — N400 Enlarged prostate without lower urinary tract symptoms: Secondary | ICD-10-CM | POA: Diagnosis not present

## 2022-01-19 DIAGNOSIS — C61 Malignant neoplasm of prostate: Secondary | ICD-10-CM | POA: Diagnosis not present

## 2022-01-31 DIAGNOSIS — Z6826 Body mass index (BMI) 26.0-26.9, adult: Secondary | ICD-10-CM | POA: Diagnosis not present

## 2022-01-31 DIAGNOSIS — W540XXA Bitten by dog, initial encounter: Secondary | ICD-10-CM | POA: Diagnosis not present

## 2022-01-31 DIAGNOSIS — L0390 Cellulitis, unspecified: Secondary | ICD-10-CM | POA: Diagnosis not present

## 2022-03-21 DIAGNOSIS — K635 Polyp of colon: Secondary | ICD-10-CM | POA: Diagnosis not present

## 2022-03-21 DIAGNOSIS — D123 Benign neoplasm of transverse colon: Secondary | ICD-10-CM | POA: Diagnosis not present

## 2022-03-21 DIAGNOSIS — K621 Rectal polyp: Secondary | ICD-10-CM | POA: Diagnosis not present

## 2022-03-21 DIAGNOSIS — Z1211 Encounter for screening for malignant neoplasm of colon: Secondary | ICD-10-CM | POA: Diagnosis not present

## 2022-03-21 DIAGNOSIS — Z86010 Personal history of colonic polyps: Secondary | ICD-10-CM | POA: Diagnosis not present

## 2022-05-18 DIAGNOSIS — R293 Abnormal posture: Secondary | ICD-10-CM | POA: Diagnosis not present

## 2022-05-18 DIAGNOSIS — M25612 Stiffness of left shoulder, not elsewhere classified: Secondary | ICD-10-CM | POA: Diagnosis not present

## 2022-05-18 DIAGNOSIS — M25511 Pain in right shoulder: Secondary | ICD-10-CM | POA: Diagnosis not present

## 2022-05-18 DIAGNOSIS — M7582 Other shoulder lesions, left shoulder: Secondary | ICD-10-CM | POA: Diagnosis not present

## 2022-05-18 DIAGNOSIS — M6281 Muscle weakness (generalized): Secondary | ICD-10-CM | POA: Diagnosis not present

## 2022-05-25 DIAGNOSIS — M7582 Other shoulder lesions, left shoulder: Secondary | ICD-10-CM | POA: Diagnosis not present

## 2022-05-25 DIAGNOSIS — R293 Abnormal posture: Secondary | ICD-10-CM | POA: Diagnosis not present

## 2022-05-25 DIAGNOSIS — M25512 Pain in left shoulder: Secondary | ICD-10-CM | POA: Diagnosis not present

## 2022-05-25 DIAGNOSIS — M25511 Pain in right shoulder: Secondary | ICD-10-CM | POA: Diagnosis not present

## 2022-05-25 DIAGNOSIS — M6281 Muscle weakness (generalized): Secondary | ICD-10-CM | POA: Diagnosis not present

## 2022-05-25 DIAGNOSIS — M25612 Stiffness of left shoulder, not elsewhere classified: Secondary | ICD-10-CM | POA: Diagnosis not present

## 2022-06-07 DIAGNOSIS — M25512 Pain in left shoulder: Secondary | ICD-10-CM | POA: Diagnosis not present

## 2022-06-07 DIAGNOSIS — M25612 Stiffness of left shoulder, not elsewhere classified: Secondary | ICD-10-CM | POA: Diagnosis not present

## 2022-06-07 DIAGNOSIS — M25511 Pain in right shoulder: Secondary | ICD-10-CM | POA: Diagnosis not present

## 2022-06-07 DIAGNOSIS — M7582 Other shoulder lesions, left shoulder: Secondary | ICD-10-CM | POA: Diagnosis not present

## 2022-06-07 DIAGNOSIS — M6281 Muscle weakness (generalized): Secondary | ICD-10-CM | POA: Diagnosis not present

## 2022-06-07 DIAGNOSIS — R293 Abnormal posture: Secondary | ICD-10-CM | POA: Diagnosis not present

## 2022-06-14 DIAGNOSIS — M6281 Muscle weakness (generalized): Secondary | ICD-10-CM | POA: Diagnosis not present

## 2022-06-14 DIAGNOSIS — R293 Abnormal posture: Secondary | ICD-10-CM | POA: Diagnosis not present

## 2022-06-14 DIAGNOSIS — M25511 Pain in right shoulder: Secondary | ICD-10-CM | POA: Diagnosis not present

## 2022-06-14 DIAGNOSIS — M25612 Stiffness of left shoulder, not elsewhere classified: Secondary | ICD-10-CM | POA: Diagnosis not present

## 2022-06-14 DIAGNOSIS — M25512 Pain in left shoulder: Secondary | ICD-10-CM | POA: Diagnosis not present

## 2022-06-14 DIAGNOSIS — M7582 Other shoulder lesions, left shoulder: Secondary | ICD-10-CM | POA: Diagnosis not present

## 2022-06-23 DIAGNOSIS — M6281 Muscle weakness (generalized): Secondary | ICD-10-CM | POA: Diagnosis not present

## 2022-06-23 DIAGNOSIS — M25511 Pain in right shoulder: Secondary | ICD-10-CM | POA: Diagnosis not present

## 2022-06-23 DIAGNOSIS — M25512 Pain in left shoulder: Secondary | ICD-10-CM | POA: Diagnosis not present

## 2022-06-23 DIAGNOSIS — R293 Abnormal posture: Secondary | ICD-10-CM | POA: Diagnosis not present

## 2022-06-23 DIAGNOSIS — M25612 Stiffness of left shoulder, not elsewhere classified: Secondary | ICD-10-CM | POA: Diagnosis not present

## 2022-06-23 DIAGNOSIS — M7582 Other shoulder lesions, left shoulder: Secondary | ICD-10-CM | POA: Diagnosis not present

## 2022-07-05 DIAGNOSIS — M25612 Stiffness of left shoulder, not elsewhere classified: Secondary | ICD-10-CM | POA: Diagnosis not present

## 2022-07-05 DIAGNOSIS — M7582 Other shoulder lesions, left shoulder: Secondary | ICD-10-CM | POA: Diagnosis not present

## 2022-07-05 DIAGNOSIS — M6281 Muscle weakness (generalized): Secondary | ICD-10-CM | POA: Diagnosis not present

## 2022-07-05 DIAGNOSIS — M25512 Pain in left shoulder: Secondary | ICD-10-CM | POA: Diagnosis not present

## 2022-07-05 DIAGNOSIS — R293 Abnormal posture: Secondary | ICD-10-CM | POA: Diagnosis not present

## 2022-07-05 DIAGNOSIS — M25511 Pain in right shoulder: Secondary | ICD-10-CM | POA: Diagnosis not present

## 2022-07-12 DIAGNOSIS — N400 Enlarged prostate without lower urinary tract symptoms: Secondary | ICD-10-CM | POA: Diagnosis not present

## 2022-07-12 DIAGNOSIS — C61 Malignant neoplasm of prostate: Secondary | ICD-10-CM | POA: Diagnosis not present

## 2022-07-14 DIAGNOSIS — M25612 Stiffness of left shoulder, not elsewhere classified: Secondary | ICD-10-CM | POA: Diagnosis not present

## 2022-07-14 DIAGNOSIS — M25512 Pain in left shoulder: Secondary | ICD-10-CM | POA: Diagnosis not present

## 2022-07-14 DIAGNOSIS — M25511 Pain in right shoulder: Secondary | ICD-10-CM | POA: Diagnosis not present

## 2022-07-14 DIAGNOSIS — M6281 Muscle weakness (generalized): Secondary | ICD-10-CM | POA: Diagnosis not present

## 2022-07-14 DIAGNOSIS — M7582 Other shoulder lesions, left shoulder: Secondary | ICD-10-CM | POA: Diagnosis not present

## 2022-07-14 DIAGNOSIS — R293 Abnormal posture: Secondary | ICD-10-CM | POA: Diagnosis not present

## 2022-09-05 DIAGNOSIS — I6529 Occlusion and stenosis of unspecified carotid artery: Secondary | ICD-10-CM | POA: Diagnosis not present

## 2022-09-05 DIAGNOSIS — Z1331 Encounter for screening for depression: Secondary | ICD-10-CM | POA: Diagnosis not present

## 2022-09-05 DIAGNOSIS — I70213 Atherosclerosis of native arteries of extremities with intermittent claudication, bilateral legs: Secondary | ICD-10-CM | POA: Diagnosis not present

## 2022-09-05 DIAGNOSIS — Z79899 Other long term (current) drug therapy: Secondary | ICD-10-CM | POA: Diagnosis not present

## 2022-09-05 DIAGNOSIS — R7302 Impaired glucose tolerance (oral): Secondary | ICD-10-CM | POA: Diagnosis not present

## 2022-09-05 DIAGNOSIS — E663 Overweight: Secondary | ICD-10-CM | POA: Diagnosis not present

## 2022-09-05 DIAGNOSIS — Z9181 History of falling: Secondary | ICD-10-CM | POA: Diagnosis not present

## 2022-09-05 DIAGNOSIS — Z6826 Body mass index (BMI) 26.0-26.9, adult: Secondary | ICD-10-CM | POA: Diagnosis not present

## 2022-09-05 DIAGNOSIS — E782 Mixed hyperlipidemia: Secondary | ICD-10-CM | POA: Diagnosis not present

## 2022-09-05 DIAGNOSIS — Z8546 Personal history of malignant neoplasm of prostate: Secondary | ICD-10-CM | POA: Diagnosis not present

## 2022-09-05 DIAGNOSIS — Z0000 Encounter for general adult medical examination without abnormal findings: Secondary | ICD-10-CM | POA: Diagnosis not present

## 2022-09-22 DIAGNOSIS — Z800 Family history of malignant neoplasm of digestive organs: Secondary | ICD-10-CM | POA: Diagnosis not present

## 2022-09-22 DIAGNOSIS — Z8249 Family history of ischemic heart disease and other diseases of the circulatory system: Secondary | ICD-10-CM | POA: Diagnosis not present

## 2022-09-22 DIAGNOSIS — Z8546 Personal history of malignant neoplasm of prostate: Secondary | ICD-10-CM | POA: Diagnosis not present

## 2022-09-22 DIAGNOSIS — Z96649 Presence of unspecified artificial hip joint: Secondary | ICD-10-CM | POA: Diagnosis not present

## 2022-09-22 DIAGNOSIS — E785 Hyperlipidemia, unspecified: Secondary | ICD-10-CM | POA: Diagnosis not present

## 2022-09-22 DIAGNOSIS — R030 Elevated blood-pressure reading, without diagnosis of hypertension: Secondary | ICD-10-CM | POA: Diagnosis not present

## 2022-09-22 DIAGNOSIS — I70209 Unspecified atherosclerosis of native arteries of extremities, unspecified extremity: Secondary | ICD-10-CM | POA: Diagnosis not present

## 2022-09-22 DIAGNOSIS — M1990 Unspecified osteoarthritis, unspecified site: Secondary | ICD-10-CM | POA: Diagnosis not present

## 2022-09-22 DIAGNOSIS — Z833 Family history of diabetes mellitus: Secondary | ICD-10-CM | POA: Diagnosis not present

## 2022-09-22 DIAGNOSIS — Z881 Allergy status to other antibiotic agents status: Secondary | ICD-10-CM | POA: Diagnosis not present

## 2022-11-10 DIAGNOSIS — H524 Presbyopia: Secondary | ICD-10-CM | POA: Diagnosis not present

## 2022-11-10 DIAGNOSIS — H25813 Combined forms of age-related cataract, bilateral: Secondary | ICD-10-CM | POA: Diagnosis not present

## 2022-11-10 DIAGNOSIS — H52223 Regular astigmatism, bilateral: Secondary | ICD-10-CM | POA: Diagnosis not present

## 2022-11-10 DIAGNOSIS — H1132 Conjunctival hemorrhage, left eye: Secondary | ICD-10-CM | POA: Diagnosis not present

## 2022-11-10 DIAGNOSIS — H5231 Anisometropia: Secondary | ICD-10-CM | POA: Diagnosis not present

## 2023-01-16 DIAGNOSIS — C61 Malignant neoplasm of prostate: Secondary | ICD-10-CM | POA: Diagnosis not present

## 2023-01-16 DIAGNOSIS — R1012 Left upper quadrant pain: Secondary | ICD-10-CM | POA: Diagnosis not present

## 2023-01-16 DIAGNOSIS — R1011 Right upper quadrant pain: Secondary | ICD-10-CM | POA: Diagnosis not present

## 2023-01-16 DIAGNOSIS — N400 Enlarged prostate without lower urinary tract symptoms: Secondary | ICD-10-CM | POA: Diagnosis not present

## 2023-01-30 DIAGNOSIS — R1012 Left upper quadrant pain: Secondary | ICD-10-CM | POA: Diagnosis not present

## 2023-01-30 DIAGNOSIS — K862 Cyst of pancreas: Secondary | ICD-10-CM | POA: Diagnosis not present

## 2023-01-30 DIAGNOSIS — R935 Abnormal findings on diagnostic imaging of other abdominal regions, including retroperitoneum: Secondary | ICD-10-CM | POA: Diagnosis not present

## 2023-01-30 DIAGNOSIS — N281 Cyst of kidney, acquired: Secondary | ICD-10-CM | POA: Diagnosis not present

## 2023-01-30 DIAGNOSIS — C61 Malignant neoplasm of prostate: Secondary | ICD-10-CM | POA: Diagnosis not present

## 2023-02-15 DIAGNOSIS — U071 COVID-19: Secondary | ICD-10-CM | POA: Diagnosis not present

## 2023-02-15 DIAGNOSIS — J22 Unspecified acute lower respiratory infection: Secondary | ICD-10-CM | POA: Diagnosis not present

## 2023-03-03 DIAGNOSIS — M25552 Pain in left hip: Secondary | ICD-10-CM | POA: Diagnosis not present

## 2023-03-03 DIAGNOSIS — Z96642 Presence of left artificial hip joint: Secondary | ICD-10-CM | POA: Diagnosis not present

## 2023-04-02 DIAGNOSIS — H6123 Impacted cerumen, bilateral: Secondary | ICD-10-CM | POA: Diagnosis not present

## 2023-05-23 DIAGNOSIS — H18413 Arcus senilis, bilateral: Secondary | ICD-10-CM | POA: Diagnosis not present

## 2023-05-23 DIAGNOSIS — H2511 Age-related nuclear cataract, right eye: Secondary | ICD-10-CM | POA: Diagnosis not present

## 2023-05-23 DIAGNOSIS — H35371 Puckering of macula, right eye: Secondary | ICD-10-CM | POA: Diagnosis not present

## 2023-05-23 DIAGNOSIS — H25043 Posterior subcapsular polar age-related cataract, bilateral: Secondary | ICD-10-CM | POA: Diagnosis not present

## 2023-05-23 DIAGNOSIS — H2513 Age-related nuclear cataract, bilateral: Secondary | ICD-10-CM | POA: Diagnosis not present

## 2023-05-31 DIAGNOSIS — H2513 Age-related nuclear cataract, bilateral: Secondary | ICD-10-CM | POA: Diagnosis not present

## 2023-05-31 DIAGNOSIS — H43813 Vitreous degeneration, bilateral: Secondary | ICD-10-CM | POA: Diagnosis not present

## 2023-05-31 DIAGNOSIS — H35371 Puckering of macula, right eye: Secondary | ICD-10-CM | POA: Diagnosis not present

## 2023-05-31 DIAGNOSIS — H43393 Other vitreous opacities, bilateral: Secondary | ICD-10-CM | POA: Diagnosis not present

## 2023-08-23 DIAGNOSIS — M7062 Trochanteric bursitis, left hip: Secondary | ICD-10-CM | POA: Diagnosis not present

## 2023-08-23 DIAGNOSIS — Z96642 Presence of left artificial hip joint: Secondary | ICD-10-CM | POA: Diagnosis not present

## 2023-08-23 DIAGNOSIS — Z471 Aftercare following joint replacement surgery: Secondary | ICD-10-CM | POA: Diagnosis not present

## 2023-08-23 DIAGNOSIS — Z96649 Presence of unspecified artificial hip joint: Secondary | ICD-10-CM | POA: Diagnosis not present

## 2023-09-04 DIAGNOSIS — H2511 Age-related nuclear cataract, right eye: Secondary | ICD-10-CM | POA: Diagnosis not present

## 2023-09-04 DIAGNOSIS — H2512 Age-related nuclear cataract, left eye: Secondary | ICD-10-CM | POA: Diagnosis not present

## 2023-09-04 DIAGNOSIS — H35371 Puckering of macula, right eye: Secondary | ICD-10-CM | POA: Diagnosis not present

## 2023-09-12 DIAGNOSIS — N3289 Other specified disorders of bladder: Secondary | ICD-10-CM | POA: Diagnosis not present

## 2023-09-12 DIAGNOSIS — C61 Malignant neoplasm of prostate: Secondary | ICD-10-CM | POA: Diagnosis not present

## 2023-09-26 DIAGNOSIS — H2512 Age-related nuclear cataract, left eye: Secondary | ICD-10-CM | POA: Diagnosis not present

## 2023-10-02 DIAGNOSIS — R519 Headache, unspecified: Secondary | ICD-10-CM | POA: Diagnosis not present

## 2023-10-02 DIAGNOSIS — R9431 Abnormal electrocardiogram [ECG] [EKG]: Secondary | ICD-10-CM | POA: Diagnosis not present

## 2023-10-02 DIAGNOSIS — H2512 Age-related nuclear cataract, left eye: Secondary | ICD-10-CM | POA: Diagnosis not present

## 2023-10-02 DIAGNOSIS — R231 Pallor: Secondary | ICD-10-CM | POA: Diagnosis not present

## 2023-10-02 DIAGNOSIS — R42 Dizziness and giddiness: Secondary | ICD-10-CM | POA: Diagnosis not present

## 2023-10-02 DIAGNOSIS — R55 Syncope and collapse: Secondary | ICD-10-CM | POA: Diagnosis not present

## 2023-10-02 DIAGNOSIS — G8918 Other acute postprocedural pain: Secondary | ICD-10-CM | POA: Diagnosis not present

## 2023-10-06 DIAGNOSIS — H43812 Vitreous degeneration, left eye: Secondary | ICD-10-CM | POA: Diagnosis not present

## 2023-12-19 DIAGNOSIS — H43812 Vitreous degeneration, left eye: Secondary | ICD-10-CM | POA: Diagnosis not present

## 2023-12-19 DIAGNOSIS — H35351 Cystoid macular degeneration, right eye: Secondary | ICD-10-CM | POA: Diagnosis not present

## 2023-12-19 DIAGNOSIS — Z961 Presence of intraocular lens: Secondary | ICD-10-CM | POA: Diagnosis not present

## 2024-01-16 DIAGNOSIS — H43812 Vitreous degeneration, left eye: Secondary | ICD-10-CM | POA: Diagnosis not present

## 2024-01-16 DIAGNOSIS — H35351 Cystoid macular degeneration, right eye: Secondary | ICD-10-CM | POA: Diagnosis not present

## 2024-01-16 DIAGNOSIS — Z961 Presence of intraocular lens: Secondary | ICD-10-CM | POA: Diagnosis not present

## 2024-03-11 DIAGNOSIS — C61 Malignant neoplasm of prostate: Secondary | ICD-10-CM | POA: Diagnosis not present

## 2024-03-11 DIAGNOSIS — N3289 Other specified disorders of bladder: Secondary | ICD-10-CM | POA: Diagnosis not present

## 2024-03-12 DIAGNOSIS — H43812 Vitreous degeneration, left eye: Secondary | ICD-10-CM | POA: Diagnosis not present

## 2024-03-12 DIAGNOSIS — H35351 Cystoid macular degeneration, right eye: Secondary | ICD-10-CM | POA: Diagnosis not present

## 2024-03-12 DIAGNOSIS — Z961 Presence of intraocular lens: Secondary | ICD-10-CM | POA: Diagnosis not present

## 2024-04-23 DIAGNOSIS — H43812 Vitreous degeneration, left eye: Secondary | ICD-10-CM | POA: Diagnosis not present

## 2024-04-23 DIAGNOSIS — Z961 Presence of intraocular lens: Secondary | ICD-10-CM | POA: Diagnosis not present

## 2024-04-23 DIAGNOSIS — H35351 Cystoid macular degeneration, right eye: Secondary | ICD-10-CM | POA: Diagnosis not present
